# Patient Record
Sex: Female | Born: 1995 | Race: White | Hispanic: No | Marital: Single | Smoking: Never smoker
Health system: Southern US, Community
[De-identification: ages and names within clinical notes are randomized; demographics above are authoritative.]

## PROBLEM LIST (undated history)

## (undated) DIAGNOSIS — F509 Eating disorder, unspecified: Secondary | ICD-10-CM

## (undated) DIAGNOSIS — F329 Major depressive disorder, single episode, unspecified: Secondary | ICD-10-CM

## (undated) DIAGNOSIS — J45909 Unspecified asthma, uncomplicated: Secondary | ICD-10-CM

## (undated) DIAGNOSIS — F419 Anxiety disorder, unspecified: Secondary | ICD-10-CM

## (undated) DIAGNOSIS — T7840XA Allergy, unspecified, initial encounter: Secondary | ICD-10-CM

## (undated) DIAGNOSIS — F99 Mental disorder, not otherwise specified: Secondary | ICD-10-CM

## (undated) DIAGNOSIS — F909 Attention-deficit hyperactivity disorder, unspecified type: Secondary | ICD-10-CM

## (undated) DIAGNOSIS — F32A Depression, unspecified: Secondary | ICD-10-CM

## (undated) HISTORY — DX: Attention-deficit hyperactivity disorder, unspecified type: F90.9

## (undated) HISTORY — PX: KNEE SURGERY: SHX244

## (undated) HISTORY — DX: Allergy, unspecified, initial encounter: T78.40XA

## (undated) HISTORY — DX: Eating disorder, unspecified: F50.9

---
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
## 2015-06-14 ENCOUNTER — Ambulatory Visit (INDEPENDENT_AMBULATORY_CARE_PROVIDER_SITE_OTHER): Payer: 59 | Admitting: Physician Assistant

## 2015-06-14 VITALS — BP 123/85 | HR 116 | Temp 98.4°F | Resp 18 | Ht 66.0 in | Wt 120.0 lb

## 2015-06-14 DIAGNOSIS — R109 Unspecified abdominal pain: Secondary | ICD-10-CM

## 2015-06-14 DIAGNOSIS — R569 Unspecified convulsions: Secondary | ICD-10-CM

## 2015-06-14 DIAGNOSIS — R232 Flushing: Secondary | ICD-10-CM | POA: Diagnosis not present

## 2015-06-14 DIAGNOSIS — R110 Nausea: Secondary | ICD-10-CM | POA: Diagnosis not present

## 2015-06-14 DIAGNOSIS — R404 Transient alteration of awareness: Secondary | ICD-10-CM | POA: Diagnosis not present

## 2015-06-14 DIAGNOSIS — IMO0001 Reserved for inherently not codable concepts without codable children: Secondary | ICD-10-CM

## 2015-06-14 LAB — POCT CBC
Granulocyte percent: 59.9 %G (ref 37–80)
HEMATOCRIT: 33.7 % — AB (ref 37.7–47.9)
Hemoglobin: 11.5 g/dL — AB (ref 12.2–16.2)
LYMPH, POC: 1.4 (ref 0.6–3.4)
MCH, POC: 25.1 pg — AB (ref 27–31.2)
MCHC: 34.2 g/dL (ref 31.8–35.4)
MCV: 73.4 fL — AB (ref 80–97)
MID (CBC): 0.4 (ref 0–0.9)
MPV: 7.1 fL (ref 0–99.8)
POC GRANULOCYTE: 2.7 (ref 2–6.9)
POC LYMPH PERCENT: 31.3 %L (ref 10–50)
POC MID %: 8.8 % (ref 0–12)
Platelet Count, POC: 189 10*3/uL (ref 142–424)
RBC: 4.59 M/uL (ref 4.04–5.48)
RDW, POC: 14.9 %
WBC: 4.5 10*3/uL — AB (ref 4.6–10.2)

## 2015-06-14 LAB — COMPLETE METABOLIC PANEL WITH GFR
ALBUMIN: 4 g/dL (ref 3.6–5.1)
ALK PHOS: 37 U/L — AB (ref 47–176)
ALT: 10 U/L (ref 5–32)
AST: 12 U/L (ref 12–32)
BUN: 11 mg/dL (ref 7–20)
CALCIUM: 9.2 mg/dL (ref 8.9–10.4)
CHLORIDE: 105 mmol/L (ref 98–110)
CO2: 27 mmol/L (ref 20–31)
Creat: 0.87 mg/dL (ref 0.50–1.00)
GFR, Est African American: >89 mL/min (ref >=60)
GFR, Est Non African American: >89 mL/min (ref >=60)
Glucose, Bld: 77 mg/dL (ref 65–99)
Potassium: 4.5 mmol/L (ref 3.8–5.1)
Sodium: 139 mmol/L (ref 135–146)
TOTAL PROTEIN: 6.3 g/dL (ref 6.3–8.2)
Total Bilirubin: 1.2 mg/dL — ABNORMAL HIGH (ref 0.2–1.1)

## 2015-06-14 LAB — TSH: TSH: 0.65 mIU/L (ref 0.50–4.30)

## 2015-06-14 LAB — POCT GLYCOSYLATED HEMOGLOBIN (HGB A1C): Hemoglobin A1C: 5.2

## 2015-06-14 LAB — GLUCOSE, POCT (MANUAL RESULT ENTRY): POC Glucose: 82 mg/dl (ref 70–99)

## 2015-06-14 NOTE — Patient Instructions (Addendum)
     IF you received an x-ray today, you will receive an invoice from [facility]. Please contact [facility] at [phone] with questions or concerns regarding your invoice.   IF you received labwork today, you will receive an invoice from [facility]. Please contact [facility] at [phone] with questions or concerns regarding your invoice.   Our billing staff will not be able to assist you with questions regarding bills from these companies.  You will be contacted with the lab results as soon as they are available. The fastest way to get your results is to activate your My Chart account. Instructions are located on the last page of this paperwork. If you have not heard from us regarding the results in 2 weeks, please contact this office.     Please stay off of the fluoxetine at this time. Please await contact to both your attention specialist, as well as neurology for evaluation.

## 2015-06-14 NOTE — Progress Notes (Signed)
[facility] [address], [place] [address] [phone]  Date:  06/14/2015   Name:  Kimberly Solomon   DOB:  [birth year]   MRN:  [id number]  PCP:  No PCP Per Patient    History of Present Illness:  Kimberly Solomon is a 20 y.o. female patient who presents to [facility] for cc of seizure like symptoms for the last 2 weeks.      Week before, she stared off, and was not responding.  She started blingking her eyes, and she finally responded.  She started shaking and having jitters.  She became very flushed and was hot, and then chills.  This lasted for 10 minutes.  She complained of stomach pain and nausea.   The next week she was back to school and somewhat fatigued.  3 days later, she felt excited this week, and taking hot and cold flashes.  She was having dizziness.  4 days ago, she had a second episode of the staring.  Blinking--jitteriness/hot and cold flashes.  Stomach pain insued.  She decreased the fluoxetine to .5 for the last 3 days.  She has attempted to get in touch, Rosanna Randy, [facility].  This has discontinued today.  Fluoxetine was for 6 weeks.   Kimberly Solomon with [facility].  She will rarely skip the methylphenidate.  She will take this on weekends.   Therapist none She sees Dr. Madie Reno +agitation 4-6 hours of sleep, she generally lays and plays on her phone.  Feels like she has to finish schoolwork before she goes to bed.   She is a Consulting civil engineer at [employer]   Patient Active Problem List   Diagnosis Date Noted  . Eating disorder 04/16/2011    Past Medical History  Diagnosis Date  . Mental disorder   . Anxiety   . Depression   . Asthma     Past Surgical History  Procedure Laterality Date  . Tympanotomy  1999  . Adenoidectomy  1999  . Wisdom tooth extraction  2015  . Knee surgery      Social History  Substance Use Topics  . Smoking status: Never Smoker   . Smokeless tobacco: Never Used  . Alcohol Use: No     No family history on file.  Allergies  Allergen Reactions  . Adhesive [Tape]   . Codeine     Sister had seizure reaction  . Latex Other (See Comments)    blisters  . Neosporin [Neomycin-Bacitracin Zn-Polymyx] Other (See Comments)    Skin burns and blisters  . Amoxicillin Rash  . Penicillins Nausea And Vomiting and Rash    Medication list has been reviewed and updated.  Current Outpatient Prescriptions on File Prior to Visit  Medication Sig Dispense Refill  . albuterol (PROVENTIL HFA;VENTOLIN HFA) 108 (90 BASE) MCG/ACT inhaler Inhale 2 puffs into the lungs every 6 (six) hours as needed for wheezing or shortness of breath.    . hydrOXYzine (VISTARIL) 50 MG capsule Take 50 mg by mouth at bedtime.    Marland Kitchen ibuprofen (ADVIL,MOTRIN) 200 MG tablet Take 400 mg by mouth every 6 (six) hours as needed for mild pain or moderate pain.    Marland Kitchen loratadine (CLARITIN) 10 MG tablet Take 10 mg by mouth daily.    . Melatonin 5 MG CAPS Take 5 mg by mouth at bedtime.    . methylphenidate 36 MG PO CR tablet Take 36 mg by mouth 2 (two) times daily.    Marland Kitchen  norgestimate-ethinyl estradiol (ORTHO-CYCLEN,SPRINTEC,PREVIFEM) 0.25-35 MG-MCG tablet Take 1 tablet by mouth daily.     No current facility-administered medications on file prior to visit.    ROS ROS otherwise unremarkable unless listed above   Physical Examination: BP 123/85 mmHg  Pulse 116  Temp(Src) 98.4 F (36.9 C)  Resp 18  Ht 5\' 6"  (1.676 m)  Wt 120 lb (54.432 kg)  BMI 19.38 kg/m2 Ideal Body Weight: Weight in (lb) to have BMI = 25: 154.6  Physical Exam  Constitutional: She is oriented to person, place, and time. She appears well-developed and well-nourished. No distress.  HENT:  Head: Normocephalic and atraumatic.  Right Ear: Tympanic membrane, external ear and ear canal normal.  Left Ear: Tympanic membrane, external ear and ear canal normal.  Nose: Right sinus exhibits no maxillary sinus tenderness and no frontal sinus tenderness. Left  sinus exhibits no maxillary sinus tenderness and no frontal sinus tenderness.  Mouth/Throat: Oropharynx is clear and moist. No uvula swelling. No oropharyngeal exudate, posterior oropharyngeal edema or posterior oropharyngeal erythema.  Eyes: Conjunctivae and EOM are normal. Pupils are equal, round, and reactive to light.  Neck: Normal range of motion. Neck supple. No thyromegaly present.  Cardiovascular: Normal rate, regular rhythm, normal heart sounds and intact distal pulses.  Exam reveals no gallop, no distant heart sounds and no friction rub.   No murmur heard. Pulmonary/Chest: Effort normal and breath sounds normal. No respiratory distress. She has no decreased breath sounds. She has no wheezes. She has no rhonchi.  Abdominal: Soft. Bowel sounds are normal. She exhibits no distension and no mass. There is no tenderness.  Musculoskeletal: Normal range of motion. She exhibits no edema or tenderness.  Lymphadenopathy:       Head (right side): No submandibular, no tonsillar, no preauricular and no posterior auricular adenopathy present.       Head (left side): No submandibular, no tonsillar, no preauricular and no posterior auricular adenopathy present.    She has no cervical adenopathy.  Neurological: She is alert and oriented to person, place, and time. She displays no atrophy and no tremor. No cranial nerve deficit. She exhibits normal muscle tone. Coordination and gait normal. She displays no Babinski's sign on the right side. She displays no Babinski's sign on the left side.  Reflex Scores:      Patellar reflexes are 2+ on the right side and 2+ on the left side. Skin: Skin is warm and dry. She is not diaphoretic.  Psychiatric: She has a normal mood and affect. Her behavior is normal.    Results for orders placed or performed in visit on 06/14/15  POCT CBC  Result Value Ref Range   WBC 4.5 (A) 4.6 - 10.2 K/uL   Lymph, poc 1.4 0.6 - 3.4   POC LYMPH PERCENT 31.3 10 - 50 %L   MID (cbc)  0.4 0 - 0.9   POC MID % 8.8 0 - 12 %M   POC Granulocyte 2.7 2 - 6.9   Granulocyte percent 59.9 37 - 80 %G   RBC 4.59 4.04 - 5.48 M/uL   Hemoglobin 11.5 (A) 12.2 - 16.2 g/dL   HCT, POC 91.433.7 (A) 78.237.7 - 47.9 %   MCV 73.4 (A) 80 - 97 fL   MCH, POC 25.1 (A) 27 - 31.2 pg   MCHC 34.2 31.8 - 35.4 g/dL   RDW, POC 95.614.9 %   Platelet Count, POC 189 142 - 424 K/uL   MPV 7.1 0 - 99.8 fL  POCT  glucose (manual entry)  Result Value Ref Range   POC Glucose 82 70 - 99 mg/dl  POCT glycosylated hemoglobin (Hb A1C)  Result Value Ref Range   Hemoglobin A1C 5.2     Assessment and Plan: Caitlin Ainley Mcnerney is a 20 y.o. female who is here today for chief complaint of staring spells, nausea, jittery, hot and cold, and abdominal pain. There are significant symptoms that fit the manifestations of serotonin syndrome.  I have advised that she not take the fluoxetine at this time. She will be referred to a neurologist at this time--her neurologist. Also referring back to her psychiatrist.  Possibility this is psychosomatic.  Seizure-like activity (HCC) - Plan: Ambulatory referral to Neurology, Ambulatory referral to Psychiatry  Nausea without vomiting - Plan: POCT glucose (manual entry), POCT glycosylated hemoglobin (Hb A1C), Ambulatory referral to Neurology, Ambulatory referral to Psychiatry  Abdominal pain, unspecified abdominal location - Plan: POCT glucose (manual entry), POCT glycosylated hemoglobin (Hb A1C), Ambulatory referral to Psychiatry  Staring spell - Plan: POCT CBC, TSH, COMPLETE METABOLIC PANEL WITH GFR, Ambulatory referral to Neurology, Ambulatory referral to Psychiatry  Skin flushed - Plan: Ambulatory referral to Psychiatry  Trena Platt, PA-C [facility] 3/28/20177:54 PM

## 2015-06-16 ENCOUNTER — Telehealth: Payer: Self-pay

## 2015-06-16 NOTE — Telephone Encounter (Signed)
Pt mom calling to let stephanie know that the seizures are more frequent  Best number [phone]

## 2015-06-17 NOTE — Telephone Encounter (Signed)
Please advise the patient to contact her neurologist to see if she can be seen sooner, or if they are ok with waiting until 4/06.

## 2015-06-17 NOTE — Telephone Encounter (Signed)
Subject: Appointment                        The patient is scheduled for an appointment on 06/23/15 at 10:30am with Dr Karel JarvisAquino. Patient aware.                    You may have to call if you want it sooner.

## 2015-06-17 NOTE — Telephone Encounter (Signed)
When is the patient's appointments with her neurologist? It may need to be moved up.

## 2015-06-20 NOTE — Telephone Encounter (Signed)
Stephanie please review

## 2015-06-20 NOTE — Telephone Encounter (Signed)
SPoke with pt, advised mom.

## 2015-06-23 ENCOUNTER — Ambulatory Visit (INDEPENDENT_AMBULATORY_CARE_PROVIDER_SITE_OTHER): Payer: 59 | Admitting: Neurology

## 2015-06-23 ENCOUNTER — Encounter: Payer: Self-pay | Admitting: Neurology

## 2015-06-23 VITALS — BP 116/80 | HR 99 | Ht 66.75 in | Wt 123.0 lb

## 2015-06-23 DIAGNOSIS — R404 Transient alteration of awareness: Secondary | ICD-10-CM

## 2015-06-23 DIAGNOSIS — F32A Depression, unspecified: Secondary | ICD-10-CM

## 2015-06-23 DIAGNOSIS — F419 Anxiety disorder, unspecified: Secondary | ICD-10-CM | POA: Diagnosis not present

## 2015-06-23 DIAGNOSIS — F329 Major depressive disorder, single episode, unspecified: Secondary | ICD-10-CM

## 2015-06-23 MED ORDER — DIAZEPAM 5 MG PO TABS: ORAL_TABLET | ORAL | Status: DC

## 2015-06-23 NOTE — Patient Instructions (Signed)
1. Schedule MRI brain with and without contrast seizure protocol 2. Schedule 1-hour sleep-deprived EEG 3. Refer to Behavioral Medicine for anxiety and depression 4. As per [place] driving laws, no driving after an episode of loss of awareness until 6 months event-free 5. Follow-up after the tests

## 2015-06-23 NOTE — Progress Notes (Signed)
NEUROLOGY CONSULTATION NOTE  Kimberly Solomon MRN: [id number] DOB: 12/30/1995  Referring provider: Trena PlattStephanie English, PA Primary care provider: Trena PlattStephanie English, PA  Reason for consult:  Staring spells  Thank you for your kind referral of Kimberly Solomon for consultation of the above symptoms. Although her history is well known to you, please allow me to reiterate it for the purpose of our medical record. The patient was accompanied to the clinic by her mother who also provides collateral information. Records and images were personally reviewed where available.  HISTORY OF PRESENT ILLNESS: This is a pleasant 20 year old right-handed woman with a history of migraines, ADHD, depression, and anxiety, presenting for new onset recurrent episodes of staring and unresponsiveness. The first episode occurred on 3/17, her mother noticed she was staring and did not respond when she waved her hand in front of her. This only lasted a few seconds and patient was amnestic of it. The second episode occurred on 3/19, her mother again noticed her staring off, she had her arms crossed over her chest rubbing her upper arms, unresponsive for 1-2 minutes. When she came to, she reported feeling hot and became flushed in the face. She also reported stomach pain after. She reports feeling them coming on, she would feel weird, her mind would go blank, she feels hot and cold, then has no recollection of events. She had another episode on 3/24 when she told her mother she was feeling funny, then again started rubbing her upper arms and went into a stare angled to the right side for 2 minutes. She started blinking fast then came to, again saying she felt hot, then her stomach started feeling bad with nausea, no vomiting. She felt better after 30 minutes. She had a similar episode on 3/26 at her boyfriend's house. On 3/27 she was driving to school then lost focus just in her left eye, she felt she could not move her left eye  and her left eyelid was slowly closing. This lasted a few minutes with some nausea, and she had a bad headache with left hand tingling. She denies any headaches with the prior episodes. That evening, she again had the funny feeling followed by feeling sick.   She had been started on Prozac for anxiety and mood swings around 6 weeks ago, and they decided to cut the dose in half due to these episodes. On 3/28, she had a longer episode where she again started feeling unwell, then started staring off and rubbing her arms and became unresponsive. Her mother noticed her right hand started shaking, followed a few minutes later by her whole body shaking. Her mother reports it did not look like a seizure, "it was weird looking," she started blinking then closed her eyes mid-way. Her breathing was fast. After 6 minutes, she opened her eyes and kept opening and closing her mouth, then said she was thirsty. She also reported her left arm was numb. They went to the Urgent Care and stopped Prozac. Since stopping Prozac, they report the episodes have increased in frequency, she was having 3 episodes of body jerks daily lasting 2-3 minutes. Last Saturday, she had a staring episode without body jerks. She has not had any further spells in the past 4 days.   She has a history of infrequent migraines, usually occurring once a year. She has "regular" headaches occurring 3 times a month with no associated nausea, vomiting, photo/phonophobia. She has occasional dizziness described as brief spinning sensation, sometimes followed  by a headache. She denies any diplopia, dysarthria, dysphagia, neck/back pain, bowel/bladder dysfunction. She denies any olfactory/gustatory hallucinations, deja vu, myoclonic jerks. She has a history of anxiety and depression, had taken Zoloft in the past and takes hydroxyzine daily. She is currently a sophomore at [employer], reports this year has been more stressful and mother reports "girl drama."  She reports grades are good, no memory changes. Her mother has migraines. Her older sister had febrile seizures. Otherwise she had a normal birth and early development.  There is no history of febrile convulsions, CNS infections such as meningitis/encephalitis, significant traumatic brain injury, neurosurgical procedures, or family history of seizures.  PAST MEDICAL HISTORY: Past Medical History  Diagnosis Date  . Mental disorder   . Anxiety   . Depression   . Asthma   . ADHD (attention deficit hyperactivity disorder)   . Eating disorder     PAST SURGICAL HISTORY: Past Surgical History  Procedure Laterality Date  . Tympanotomy  1999  . Adenoidectomy  1999  . Wisdom tooth extraction  2015  . Knee surgery Left     MEDICATIONS: Current Outpatient Prescriptions on File Prior to Visit  Medication Sig Dispense Refill  . albuterol (PROVENTIL HFA;VENTOLIN HFA) 108 (90 BASE) MCG/ACT inhaler Inhale 2 puffs into the lungs every 6 (six) hours as needed for wheezing or shortness of breath.    . hydrOXYzine (VISTARIL) 50 MG capsule Take 50 mg by mouth daily. Takes 1 time up to 3 times daily as needed    . ibuprofen (ADVIL,MOTRIN) 200 MG tablet Take 400 mg by mouth every 6 (six) hours as needed for mild pain or moderate pain.    Marland Kitchen loratadine (CLARITIN) 10 MG tablet Take 10 mg by mouth daily.    . Melatonin 5 MG CAPS Take 5 mg by mouth at bedtime.    . methylphenidate 36 MG PO CR tablet Take 36 mg by mouth 2 (two) times daily.    . norgestimate-ethinyl estradiol (ORTHO-CYCLEN,SPRINTEC,PREVIFEM) 0.25-35 MG-MCG tablet Take 1 tablet by mouth daily.     No current facility-administered medications on file prior to visit.    ALLERGIES: Allergies  Allergen Reactions  . Adhesive [Tape]   . Codeine     Sister had seizure reaction  . Latex Other (See Comments)    blisters  . Neosporin [Neomycin-Bacitracin Zn-Polymyx] Other (See Comments)    Skin burns and blisters  . Amoxicillin Rash  .  Penicillins Nausea And Vomiting and Rash    FAMILY HISTORY: Family History  Problem Relation Age of Onset  . Diabetes Paternal Grandfather   . Hypertension Paternal Grandfather   . Stroke Paternal Grandfather   . Heart disease Paternal Grandfather   . Hyperlipidemia Paternal Grandfather   . Migraines Mother     SOCIAL HISTORY: Social History   Social History  . Marital Status: Single    Spouse Name: N/A  . Number of Children: 0  . Years of Education: N/A   Occupational History  . Student    Social History Main Topics  . Smoking status: Never Smoker   . Smokeless tobacco: Never Used  . Alcohol Use: No  . Drug Use: No  . Sexual Activity: No   Other Topics Concern  . Not on file   Social History Narrative    REVIEW OF SYSTEMS: Constitutional: No fevers, chills, or sweats, no generalized fatigue, change in appetite Eyes: No visual changes, double vision, eye pain Ear, nose and throat: No hearing loss, ear pain,  nasal congestion, sore throat Cardiovascular: No chest pain, palpitations Respiratory:  No shortness of breath at rest or with exertion, wheezes GastrointestinaI: No nausea, vomiting, diarrhea, abdominal pain, fecal incontinence Genitourinary:  No dysuria, urinary retention or frequency Musculoskeletal:  No neck pain, back pain Integumentary: No rash, pruritus, skin lesions Neurological: as above Psychiatric: No depression, insomnia, anxiety Endocrine: No palpitations, fatigue, diaphoresis, mood swings, change in appetite, change in weight, increased thirst Hematologic/Lymphatic:  No anemia, purpura, petechiae. Allergic/Immunologic: no itchy/runny eyes, nasal congestion, recent allergic reactions, rashes  PHYSICAL EXAM: Filed Vitals:   06/23/15 1010  BP: 116/80  Pulse: 99   General: No acute distress Head:  Normocephalic/atraumatic Eyes: Fundoscopic exam shows bilateral sharp discs, no vessel changes, exudates, or hemorrhages Neck: supple, no  paraspinal tenderness, full range of motion Back: No paraspinal tenderness Heart: regular rate and rhythm Lungs: Clear to auscultation bilaterally. Vascular: No carotid bruits. Skin/Extremities: No rash, no edema Neurological Exam: Mental status: alert and oriented to person, place, and time, no dysarthria or aphasia, Fund of knowledge is appropriate.  Recent and remote memory are intact. 3/3 delayed recall. Attention and concentration are normal.    Able to name objects and repeat phrases. Cranial nerves: CN I: not tested CN II: pupils equal, round and reactive to light, visual fields intact, fundi unremarkable. CN III, IV, VI:  full range of motion, no nystagmus, no ptosis CN V: facial sensation intact CN VII: upper and lower face symmetric CN VIII: hearing intact to finger rub CN IX, X: gag intact, uvula midline CN XI: sternocleidomastoid and trapezius muscles intact CN XII: tongue midline Bulk & Tone: normal, no fasciculations. Motor: 5/5 throughout with no pronator drift. Sensation: intact to light touch, cold, pin, vibration and joint position sense.  No extinction to double simultaneous stimulation.  Romberg test negative Deep Tendon Reflexes: +2 throughout, no ankle clonus Plantar responses: downgoing bilaterally Cerebellar: no incoordination on finger to nose, heel to shin. No dysdiadochokinesia Gait: narrow-based and steady, able to tandem walk adequately. Tremor: none  IMPRESSION: This is a pleasant 20 year old right-handed woman with a history of migraines, depression, anxiety, presenting for recurrent fairly stereotyped episodes of staring and unresponsiveness with possible hand automatisms that started 3 weeks ago. Since then, she has had similar staring spells but with body jerks. Focal seizures with impaired awareness are considered, however the description of the body jerks also raises the possibility of psychogenic non-epileptic events (PNES). MRI brain with and without  contrast and a 1-hour sleep-deprived EEG will be ordered to assess for focal abnormalities that increase risk for recurrent seizures. If EEG normal, we will plan for a 48-hour EEG to further classify these episodes. Would hold off on starting medication for now until tests are done. We also discussed the possibility of PNES, she does endorse stress and feels these may be related to stress, and is agreeable to seeing Behavioral Medicine.  [place] driving laws were discussed with the patient, and she knows to stop driving after an episode of loss of awareness, until 6 months event-free. She will follow-up after the tests.   Thank you for allowing me to participate in the care of this patient. Please do not hesitate to call for any questions or concerns.   Patrcia Dolly, M.D.  CC: Trena Platt, [place]

## 2015-06-30 ENCOUNTER — Ambulatory Visit
Admission: RE | Admit: 2015-06-30 | Discharge: 2015-06-30 | Disposition: A | Discharge status: Home / Self Care | Payer: 59 | Source: Ambulatory Visit | Attending: Neurology | Admitting: Neurology

## 2015-06-30 ENCOUNTER — Other Ambulatory Visit: Payer: Self-pay

## 2015-06-30 MED ORDER — GADOBENATE DIMEGLUMINE 529 MG/ML IV SOLN
11.0000 mL | Freq: Once | INTRAVENOUS | Status: AC | PRN
  Administered 2015-06-30: 11 mL via INTRAVENOUS

## 2015-07-05 ENCOUNTER — Telehealth: Payer: Self-pay | Admitting: Family Medicine

## 2015-07-05 NOTE — Telephone Encounter (Signed)
-----   Message from Van ClinesKaren M Aquino, MD sent at 07/04/2015  2:54 PM EDT ----- Pls let her know I reviewed MRI brain and it looks good, no evidence of tumor, stroke, or bleed. Proceed with EEG as discussed. Thanks

## 2015-07-05 NOTE — Telephone Encounter (Signed)
I spoke with patients mother/Angelia and notified her of result & advisement.

## 2015-07-11 ENCOUNTER — Ambulatory Visit (INDEPENDENT_AMBULATORY_CARE_PROVIDER_SITE_OTHER): Payer: 59 | Admitting: Neurology

## 2015-07-11 DIAGNOSIS — R404 Transient alteration of awareness: Secondary | ICD-10-CM | POA: Diagnosis not present

## 2015-07-11 NOTE — Procedures (Signed)
ONE HOUR SLEEP-DEPRIVED ELECTROENCEPHALOGRAM REPORT  Date of Study: 07/11/2015  Patient's Name: Kimberly Solomon MRN: [id number] Date of Birth: 1996-03-14  Referring Provider: Patrcia DollyKaren Aquino, MD  Indication: recurrent transient altered awareness/staring spells  Medications: Melatonin Vistaril claritin Methylphenidate Ortho-cylcen  Technical Summary: This is a multichannel digital EEG recording, using the international 10-20 placement system with electrodes applied with paste and impedances below 5000 ohms.    Description: The EEG background is symmetric, with a well-developed posterior dominant rhythm of 11-12 Hz, which is reactive to eye opening and closing.  Diffuse beta activity is seen, with a bilateral frontal preponderance.  No focal or generalized abnormalities are seen.  No focal or generalized epileptiform discharges are seen.  Stage II sleep is seen, with normal and symmetric sleep patterns.  Hyperventilation and photic stimulation were performed, and produced no abnormalities.  ECG revealed normal cardiac rate and rhythm.  Impression: This is a normal one-hour sleep-deprived EEG of the awake and asleep states, with activating procedures.  A normal study does not rule out the possibility of a seizure disorder in this patient.  Adam R. Everlena CooperJaffe, DO

## 2015-07-18 ENCOUNTER — Telehealth (HOSPITAL_COMMUNITY): Payer: Self-pay

## 2015-07-22 ENCOUNTER — Ambulatory Visit: Payer: Self-pay | Admitting: Neurology

## 2015-08-01 ENCOUNTER — Telehealth: Payer: Self-pay | Admitting: Neurology

## 2015-08-01 DIAGNOSIS — R404 Transient alteration of awareness: Secondary | ICD-10-CM

## 2015-08-01 NOTE — Telephone Encounter (Signed)
Routine EEG is normal.  Per Dr. Rosalyn GessAquino's last note, she would like to proceed with 48-hour ambulatory EEG.

## 2015-08-01 NOTE — Telephone Encounter (Signed)
Spoke to patients mother about results. Agreeable to proceed with 48 hour eeg. Will put in order & Darl PikesSusan will call with an appt.

## 2015-08-01 NOTE — Telephone Encounter (Signed)
PT's mother Marina Gravelngelia called and wanted to know if her test results were ready yet/Dawn CB# [phone]

## 2015-08-01 NOTE — Telephone Encounter (Signed)
Patients mother is requesting results of EEG.

## 2015-08-16 ENCOUNTER — Ambulatory Visit (INDEPENDENT_AMBULATORY_CARE_PROVIDER_SITE_OTHER): Payer: 59 | Admitting: Neurology

## 2015-08-16 DIAGNOSIS — R404 Transient alteration of awareness: Secondary | ICD-10-CM

## 2015-08-19 ENCOUNTER — Telehealth: Payer: Self-pay | Admitting: Family Medicine

## 2015-08-19 NOTE — Telephone Encounter (Signed)
Patients mother/Angelia returned my call. I notified her of results. Also reminded her about [place]ly [place] driving law, that Kimberly Solomon has to be 6 mths event free before she can drive.

## 2015-08-19 NOTE — Telephone Encounter (Signed)
Lmovm to rtn my call. 

## 2015-08-19 NOTE — Procedures (Signed)
48 HOUR AMBULATORY ELECTROENCEPHALOGRAM REPORT  Dates of Study: 08/16/2015 to 08/18/2015  Patient's Name: Kimberly GeraldBritney K Solomon MRN: [id number] Date of Birth: [birth year]  Referring Provider: Patrcia DollyKaren Aquino, MD  Indication: Recurrent staring spells.  Medications: Hydroxyzine, Melatonin, Iron, Sprintec, Claritin, Methyphenidate, Qnasal  Technical Summary: This is a multichannel digital EEG recording, using the international 10-20 placement system with electrodes applied with paste and impedances below 5000 ohms.    Description: The EEG background is symmetric, with a well-developed posterior dominant rhythm of 11 Hz, which is reactive to eye opening and closing.  Diffuse beta activity is seen, with a bilateral frontal preponderance.  No focal or generalized abnormalities are seen.  No focal or generalized epileptiform discharges are seen.  She did not exhibit her clinical habitual spells during the recording.  Stage II sleep is seen, with normal and symmetric sleep patterns.  ECG revealed normal cardiac rate and rhythm.  Impression: This is a normal 48 hour ambulatory EEG.  The patient's habitual spells were not captured on this study.  Therefore, this normal EEG does not rule out a seizure disorder in this patient.  Adam R. Everlena CooperJaffe, DO

## 2015-08-19 NOTE — Telephone Encounter (Signed)
-----   Message from Drema DallasAdam R Jaffe, DO sent at 08/19/2015  8:09 AM EDT ----- Ambulatory EEG is normal, although she did not demonstrate any of her spells.  No further changes, such as medication, is recommended at this time until she follows up with Dr. Karel JarvisAquino.  Again, as per [place] law, she cannot drive until she has not had a spell for 6 months.

## 2015-09-27 ENCOUNTER — Encounter (HOSPITAL_COMMUNITY): Payer: Self-pay | Admitting: Psychiatry

## 2015-09-27 ENCOUNTER — Ambulatory Visit (INDEPENDENT_AMBULATORY_CARE_PROVIDER_SITE_OTHER): Payer: 59 | Admitting: Psychiatry

## 2015-09-27 VITALS — BP 110/78 | HR 81 | Ht 67.0 in | Wt 124.4 lb

## 2015-09-27 DIAGNOSIS — F411 Generalized anxiety disorder: Secondary | ICD-10-CM | POA: Diagnosis not present

## 2015-09-27 DIAGNOSIS — F4010 Social phobia, unspecified: Secondary | ICD-10-CM

## 2015-09-27 DIAGNOSIS — F330 Major depressive disorder, recurrent, mild: Secondary | ICD-10-CM | POA: Diagnosis not present

## 2015-09-27 DIAGNOSIS — F410 Panic disorder [episodic paroxysmal anxiety] without agoraphobia: Secondary | ICD-10-CM | POA: Insufficient documentation

## 2015-09-27 DIAGNOSIS — F902 Attention-deficit hyperactivity disorder, combined type: Secondary | ICD-10-CM | POA: Insufficient documentation

## 2015-09-27 DIAGNOSIS — F5000 Anorexia nervosa, unspecified: Secondary | ICD-10-CM
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
## 2015-10-13 ENCOUNTER — Encounter: Payer: Self-pay | Admitting: Neurology

## 2015-10-13 ENCOUNTER — Ambulatory Visit (INDEPENDENT_AMBULATORY_CARE_PROVIDER_SITE_OTHER): Payer: 59 | Admitting: Neurology

## 2015-10-13 VITALS — BP 120/80 | HR 120 | Ht 67.0 in | Wt 125.0 lb

## 2015-10-13 DIAGNOSIS — F329 Major depressive disorder, single episode, unspecified: Secondary | ICD-10-CM | POA: Diagnosis not present

## 2015-10-13 DIAGNOSIS — F32A Depression, unspecified: Secondary | ICD-10-CM

## 2015-10-13 DIAGNOSIS — F419 Anxiety disorder, unspecified: Secondary | ICD-10-CM

## 2015-10-13 NOTE — Patient Instructions (Addendum)
You look great! Continue follow-up with your psychiatrist. Follow-up on an as needed basis, call our office for any changes.

## 2015-10-13 NOTE — Progress Notes (Signed)
NEUROLOGY FOLLOW UP OFFICE NOTE  Kimberly Solomon [id number]  HISTORY OF PRESENT ILLNESS: I had the pleasure of seeing Kimberly Solomon in follow-up in the neurology clinic on 10/13/2015 for recurrent episodes of staring and unresponsiveness that started in March 2017. The patient was last seen 3 months ago and is again accompanied by her mother who helps supplement the history today.  Records and images were personally reviewed where available.  I personally reviewed MRI brain with and without contrast which was normal, hippocampi symmetric with no abnormal signal or enhancement. 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 Her 1-hour sleep-deprived and prolonged 48-hour EEG were normal, typical events were not captured. She and her mother report that she has not had any further episodes of staring/unresponsiveness since the end of March. Debroah denies any further body jerks, paresthesias, no headaches. Her mother reports that she has been better since they stopped the Prozac, however they had previously reported that after stopping Prozac, the episodes increased in frequency. She has seen Psychiatry and will work on anxiety. She denies any dizziness, diplopia, focal numbness/tingling/weakness. She has some "sensory issues," where she is very sensitive to sounds and the ticking of a clock. She wears an ankle brace after she slipped on the stairs last weekend walking her dog, no loss of consciousness.  HPI 06/23/2015: This is a pleasant 20 yo RH woman with a history of migraines, ADHD, depression, and anxiety, who presented for new onset recurrent episodes of staring and unresponsiveness. The first episode occurred on 3/17, her mother noticed she was staring and did not respond when she waved her hand in front of her. This only lasted a few seconds and patient  was amnestic of it. The second episode occurred on 3/19, her mother again noticed her staring off, she had her arms crossed over her chest rubbing her upper arms, unresponsive for 1-2 minutes. When she came to, she reported feeling hot and became flushed in the face. She also reported stomach pain after. She reports feeling them coming on, she would feel weird, her mind would go blank, she feels hot and cold, then has no recollection of events. She had another episode on 3/24 when she told her mother she was feeling funny, then again started rubbing her upper arms and went into a stare angled to the right side for 2 minutes. She started blinking fast then came to, again saying she felt hot, then her stomach started feeling bad with nausea, no vomiting. She felt better after 30 minutes. She had a similar episode on 3/26 at her boyfriend's house. On 3/27 she was driving to school then lost focus just in her left eye, she felt she could not move her left eye and her left eyelid was slowly closing. This lasted a few minutes with some nausea, and she had a bad headache with left hand tingling. She denies any headaches with the prior episodes. That evening, she again had the funny feeling followed by feeling sick.   She had been started on Prozac for anxiety and mood swings around 6 weeks ago, and they decided to cut the dose in half due to these episodes. On 3/28, she had a longer episode where she again started feeling unwell, then started staring off and rubbing her arms and became unresponsive. Her mother noticed her right hand started shaking, followed a  few minutes later by her whole body shaking. Her mother reports it did not look like a seizure, "it was weird looking," she started blinking then closed her eyes mid-way. Her breathing was fast. After 6 minutes, she opened her eyes and kept opening and closing her mouth, then said she was thirsty. She also reported her left arm was numb. [facility] and [facility]. Since stopping Prozac, they report the episodes have increased in frequency, she was having 3 episodes of body jerks daily lasting 2-3 minutes. Last Saturday, she had a staring episode without body jerks. She has not had any further spells in the past 4 days.   She has a history of anxiety and depression, had taken Zoloft in the past and takes hydroxyzine daily. She is currently a sophomore at [employer], reports this year has been more stressful and mother reports "girl drama." She reports grades are good, no memory changes. Her mother has migraines. Her older sister had febrile seizures. Otherwise she had a normal birth and early development.  There is no history of febrile convulsions, CNS infections such as meningitis/encephalitis, significant traumatic brain injury, neurosurgical procedures, or family history of seizures  PAST MEDICAL HISTORY: Past Medical History:  Diagnosis Date  . ADHD (attention deficit hyperactivity disorder)   . Anxiety   . Asthma   . Depression   . Eating disorder   . Mental disorder     MEDICATIONS: Current Outpatient Prescriptions on File Prior to Visit  Medication Sig Dispense Refill  . albuterol (PROVENTIL HFA;VENTOLIN HFA) 108 (90 BASE) MCG/ACT inhaler Inhale 2 puffs into the lungs every 6 (six) hours as needed for wheezing or shortness of breath.    . Beclomethasone Dipropionate (QNASL) 80 MCG/ACT AERS Place 2 sprays into both nostrils daily.    . ferrous sulfate 325 (65 FE) MG tablet Take 325 mg by mouth daily with breakfast.    . hydrOXYzine (VISTARIL) 50 MG capsule Take 50 mg by mouth daily. Takes 1 time up to 3 times daily as needed    . ibuprofen (ADVIL,MOTRIN) 200 MG tablet Take 400 mg by mouth every 6 (six) hours as needed for mild pain or moderate pain.    Marland Kitchen loratadine (CLARITIN) 10 MG tablet Take 10 mg by mouth daily.    . Melatonin 5 MG CAPS Take 5 mg by mouth at bedtime.    . methylphenidate 36 MG PO CR tablet Take  36 mg by mouth 2 (two) times daily.    . Multiple Vitamins-Minerals (MULTIVITAMIN WITH MINERALS) tablet Take 1 tablet by mouth daily.    . norgestimate-ethinyl estradiol (ORTHO-CYCLEN,SPRINTEC,PREVIFEM) 0.25-35 MG-MCG tablet Take 1 tablet by mouth daily.     No current facility-administered medications on file prior to visit.     ALLERGIES: Allergies  Allergen Reactions  . Adhesive [Tape]   . Codeine     Sister had seizure reaction  . Latex Other (See Comments)    blisters  . Neosporin [Neomycin-Bacitracin Zn-Polymyx] Other (See Comments)    Skin burns and blisters  . Amoxicillin Rash  . Penicillins Nausea And Vomiting and Rash    FAMILY HISTORY: Family History  Problem Relation Age of Onset  . Diabetes Paternal Grandfather   . Hypertension Paternal Grandfather   . Stroke Paternal Grandfather   . Heart disease Paternal Grandfather   . Hyperlipidemia Paternal Grandfather   . Migraines Mother   . Anxiety disorder Mother   . Anxiety disorder Maternal Grandfather   .  Depression Maternal Grandfather   . Anxiety disorder Maternal Grandmother   . Suicidality Neg Hx   . ADD / ADHD Father     SOCIAL HISTORY: Social History   Social History  . Marital status: Single    Spouse name: N/A  . Number of children: 0  . Years of education: N/A   Occupational History  . Student    Social History Main Topics  . Smoking status: Never Smoker  . Smokeless tobacco: Never Used  . Alcohol use 0.0 oz/week     Comment: rare- last drank summer of 2016- usually 1 drink per episode  . Drug use: No  . Sexual activity: No   Other Topics Concern  . Not on file   Social History Narrative   Born in [place] but raised in [place] by both parents. Pt has one older sister. Pt goes to [employer] and lives alone. She is studying education. Single, never married, no kids.     REVIEW OF SYSTEMS: Constitutional: No fevers, chills, or sweats, no generalized fatigue, change in  appetite Eyes: No visual changes, double vision, eye pain Ear, nose and throat: No hearing loss, ear pain, nasal congestion, sore throat Cardiovascular: No chest pain, palpitations Respiratory:  No shortness of breath at rest or with exertion, wheezes GastrointestinaI: No nausea, vomiting, diarrhea, abdominal pain, fecal incontinence Genitourinary:  No dysuria, urinary retention or frequency Musculoskeletal:  No neck pain, back pain Integumentary: No rash, pruritus, skin lesions Neurological: as above Psychiatric: No depression, insomnia, +anxiety Endocrine: No palpitations, fatigue, diaphoresis, mood swings, change in appetite, change in weight, increased thirst Hematologic/Lymphatic:  No anemia, purpura, petechiae. Allergic/Immunologic: no itchy/runny eyes, nasal congestion, recent allergic reactions, rashes  PHYSICAL EXAM: Vitals:   10/13/15 0820  BP: 120/80  Pulse: (!) 120   General: No acute distress Head:  Normocephalic/atraumatic Neck: supple, no paraspinal tenderness, full range of motion Heart:  Regular rate and rhythm Lungs:  Clear to auscultation bilaterally Back: No paraspinal tenderness Skin/Extremities: No rash, no edema Neurological Exam: alert and oriented to person, place, and time. No aphasia or dysarthria. Fund of knowledge is appropriate.  Recent and remote memory are intact.  Attention and concentration are normal.    Able to name objects and repeat phrases. Cranial nerves: Pupils equal, round, reactive to light. Extraocular movements intact with no nystagmus. Visual fields full. Facial sensation intact. No facial asymmetry. Tongue, uvula, palate midline.  Motor: Bulk and tone normal, muscle strength 5/5 throughout with no pronator drift.  Sensation to light touch intact.  No extinction to double simultaneous stimulation.  Deep tendon reflexes 2+ throughout, toes downgoing.  Finger to nose testing intact.  Gait narrow-based and steady, able to tandem walk adequately.   Romberg negative.  IMPRESSION: This is a pleasant 20 yo RH woman with a history of migraines, depression, anxiety, who presented with new onset recurrent episodes of staring and unresponsiveness that started in March 2017, followed by staring spells with body jerks. MRI brain and prolonged 48-hour EEG normal. She has had no further episodes since the end of March. There is concern that Prozac had caused these symptoms, this would be an unusual side effect, if ever. There is no evidence of neurological cause for her symptoms, these were most likely related to psychiatric cause. No indication for starting medication from neurological standpoint. Recommend continued follow-up with psychiatry for anxiety. She will follow-up on a prn basis and knows to call for any changes.   Thank you for allowing  me to participate in her care.  Please do not hesitate to call for any questions or concerns.  The duration of this appointment visit was 15 minutes of face-to-face time with the patient.  Greater than 50% of this time was spent in counseling, explanation of diagnosis, planning of further management, and coordination of care.   Patrcia Dolly, M.D.   CC: Dr. Michae Kava

## 2016-03-01 ENCOUNTER — Ambulatory Visit (HOSPITAL_COMMUNITY): Payer: Self-pay | Admitting: Psychiatry

## 2016-09-04 ENCOUNTER — Ambulatory Visit (INDEPENDENT_AMBULATORY_CARE_PROVIDER_SITE_OTHER): Payer: 59 | Admitting: Sports Medicine

## 2016-09-04 ENCOUNTER — Ambulatory Visit: Payer: Self-pay

## 2016-09-04 ENCOUNTER — Encounter: Payer: Self-pay | Admitting: Sports Medicine

## 2016-09-04 VITALS — BP 118/82 | Ht 67.0 in | Wt 123.0 lb

## 2016-09-04 DIAGNOSIS — M25562 Pain in left knee: Secondary | ICD-10-CM

## 2016-09-04 DIAGNOSIS — M25569 Pain in unspecified knee: Secondary | ICD-10-CM

## 2016-09-04 DIAGNOSIS — G8929 Other chronic pain: Secondary | ICD-10-CM

## 2016-09-04 MED ORDER — AMITRIPTYLINE HCL 25 MG PO TABS: 25.0000 mg | ORAL_TABLET | Freq: Every day | ORAL | 1 refills | Status: DC

## 2016-09-04 NOTE — Progress Notes (Signed)
  CC:  Left knee pain/ Chronic  Referred by Dr. 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 knee pain 2 previous arthroscopic knee surgeries for meniscus tears 2015 and 2016 She recovered from those and was a cheerleader throughout HS  In February 2018 she had a twisting injury to her left knee while playing twister Physical therapy, injection and diclofenac have failed to resolve her pain MRI obtained was unremarkable  Symptoms now are frequent pain in the left knee Pain awakens her at night Knee feels stiff Note giving way locking or mechanical symptoms At the end of the day she may have some mild swelling  Past medical history Attention deficit disorder Asthma Generalized anxiety disorder  Medications - they are as listed and are reviewed  Review of systems She gets color change in the entire left leg Since the time of the injury she is also getting times when both feet will turn bluish Intermittently the left knee will feel tightness No other joint swelling  Physical exam Pleasant thin female in no acute distress BP 118/82   Ht 5\' 7"  (1.702 m)   Wt 123 lb (55.8 kg)   BMI 19.26 kg/m   She has significant hypermobility Both shoulders Elbow hyperextension Easily can put her palms on the floor Hyperextension of the right knee Excess rotation of both ankles Beighton score of 4  RT Knee: Normal to inspection with no erythema or effusion or obvious bony abnormalities. Palpation normal with no warmth or joint line tenderness or patellar tenderness or condyle tenderness. ROM normal  increased in flexion and extension  Ligaments with solid consistent endpoints including ACL, PCL, LCL, MCL. Negative Mcmurray's and provocative meniscal tests. Non painful patellar compression. Patellar and quadriceps tendons unremarkable. Hamstring and quadriceps strength is normal.  LT knee Stable exam as above However - she shows 30 deg less flexion No  hyperextension arthroscopy scars Mild puffiness  Ultrasound of left knee  Quadriceps tendon intact Patellar tendon intact Medial and lateral joint line show no swelling Meniscus looks intact medial and lateral Small joint effusion in the suprapatellar pouch not seen on the right There is some hyperechoic change and possible thickening of the joint capsule  Ultrasound and interpretation by Sibyl ParrKarl B. Darrick PennaFields, MD

## 2016-09-04 NOTE — Assessment & Plan Note (Signed)
The knee is stable and I do not think there is any new intra-articular injury I suspect this is a variant of complex regional pain syndrome with reflex dystrophy type changes in the left leg  Trial of easy motion exercises Compression fracture that he Amitriptyline 25 at night as she has disturbed sleep related to pain  I advised patience as this may take 6 to 12 mos.  Recheck in 6 weeks

## 2016-09-04 NOTE — Patient Instructions (Signed)
You have a reaction in your knee  This is called complex regional pain syndrome or reflex sympathetic dystrophy Similar to a frozen shoulder  We will try  Twice daily do a lot of easy motion exercise for the knee For maintenance of quad strength  Do straight leg raises with a 2 to 3 lb ankle weight  3 sets of 15 Turn foot out  Swimming and biking are helpful  Medicine Add amitriptyline 25 at night Makes you drowsy and mouth dry  Diclofenac use only when painful  Heat before motion exercise is good Compression sleeve when active At end of day if swollen try some ice  Recheck after you have been on medicine for 6 weeks

## 2016-10-11 ENCOUNTER — Ambulatory Visit: Payer: 59 | Admitting: Sports Medicine

## 2016-10-18 ENCOUNTER — Ambulatory Visit: Payer: 59 | Admitting: Sports Medicine

## 2016-10-24 ENCOUNTER — Other Ambulatory Visit: Payer: Self-pay

## 2016-10-24 MED ORDER — AMITRIPTYLINE HCL 25 MG PO TABS: 25.0000 mg | ORAL_TABLET | Freq: Every day | ORAL | 1 refills | Status: DC

## 2016-10-30 ENCOUNTER — Ambulatory Visit (INDEPENDENT_AMBULATORY_CARE_PROVIDER_SITE_OTHER): Payer: 59 | Admitting: Sports Medicine

## 2016-10-30 VITALS — BP 118/80 | Ht 67.0 in | Wt 125.0 lb

## 2016-10-30 DIAGNOSIS — M25562 Pain in left knee: Secondary | ICD-10-CM | POA: Diagnosis not present

## 2016-10-30 DIAGNOSIS — G5772 Causalgia of left lower limb: Secondary | ICD-10-CM | POA: Diagnosis not present

## 2016-10-30 DIAGNOSIS — G8929 Other chronic pain: Secondary | ICD-10-CM

## 2016-10-30 NOTE — Progress Notes (Signed)
Chief complaint: Follow-up left knee pain 6 months  History of present illness: Kimberly Solomon is a 21 year old female who presents to the sports medicine office today for follow-up of left knee pain. She has had this pain for the last 6 months. Back in Feb 2018, she did have a twisting injury to her left knee while playing twister. She did follow with local orthopedic office, started with physical therapy, cortisone injection, and diclofenac, all with no interval improvement in her symptoms. MRI of her left knee was obtained, did not show any acute abnormality. She was referred here by Dr Thurston Hole for second opinion evaluation.  She was seen here back on 09/04/16, with symptoms of pain in the left knee that is awakening her at nighttime. She also describes her knee feeling stiff. She reports having pain in the anterior left knee, feels more deep. She describes the pain as an aching pain. She is not report of any radiation of pain. She does not report of any popping, locking, or catching symptoms. Physical exam was overall unremarkable, ligaments that solid consistent endpoints and she had negative meniscal provocative testing.   Ultrasound the left knee was done, showed that her quadriceps tendon was intact, patellar tendon was intact, medial lateral joint line shows no swelling and meniscus looks intact both medially and laterally. She did have a small effusion in the suprapatellar pouch that was seen in the left side and not seen on the right side. She did have possible thickening of the joint capsule.   Main thing was that she did have limitation with flexion on physical examination, she could only go to 30 of flexion. Overall, it was felt that this was due to complex regional pain syndrome. She was started on a trial of easy motion exercises and was started on amitriptyline 25 mg nightly.   Overall today, she does report of significant interval improvement in her symptoms. She does not have any pain at  rest. She reports that only aggravating activity as squatting. She reports that when this occurs she does have pain rating at a 4 or 5 out of 10. (down from 8/10)  She reports that she has not been doing physical therapy as often as she would like, but has been doing it at home. She does report having more flexibility, particularly with left knee flexion, still having some pain but is able to flex her knee more. She does not report of any warmth, erythema, ecchymosis, or effusion. She reports of occasional popping and crepitus, but reports this is been a chronic issue for her and nothing new. She does not report of any symptoms of her left leg giving out on her. She reports she has been taken amitriptyline 25 mg nightly, has not noticed any side effects with this.  Review of systems:  As stated above  Physical exam: Vital signs are reviewed and are documented in the chart Gen.: Alert, oriented, appears stated age, in no apparent distress HEENT: Moist oral mucosa Respiratory: Normal respirations, able to speak in full sentences Cardiac: Regular rate, distal pulses 2+ Integumentary: No rashes on visible skin:  Neurologic: Strength 5/5 lateral lower extremities, sensation 2+ bilateral lower extremities Psych: Normal affect, mood is described as good Musculoskeletal: Inspection of left knee reveals stable scars from left knee arthroscopically, no obvious deformity or muscular atrophy seen, no warmth, erythema, ecchymosis, or effusion noted, no tenderness to palpation over quadriceps tendon, patellar tendon, medial joint line, lateral joint line, or popliteal fossa, Lachman, anterior drawer,  valgus, and varus stress testing negative, McMurray negative, she does have increased range of motion today, is able to flex her knee back to 60, was able to fully extend her left knee  Assessment and plan: 1. Left knee pain, suspect complex regional pain syndrome  Left knee pain -Suspect given interval  improvement with amitriptyline that this is related to complex regional pain syndrome and reflex symptom dystrophy, which can be seen after surgery -Discussed with her to continue with amitriptyline 25 mg nightly -At this point in time, she can start with doing elliptical and stationary biking, discussed if she has any pain to discontinue doing this -Discussed with her to continue with her home exercise program  Will have patient follow-up in 3 months or sooner as needed    Kimberly Solomon, M.D. Primary Care Sports Medicine Fellow [facility]  I observed and examined the patient with the fellow and agree with assessment and plan.  Note reviewed and modified by me.  Kimberly BaasKarl Fields, MD

## 2016-10-30 NOTE — Assessment & Plan Note (Signed)
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

## 2016-12-27 ENCOUNTER — Other Ambulatory Visit: Payer: Self-pay

## 2016-12-27 MED ORDER — AMITRIPTYLINE HCL 25 MG PO TABS: 25.0000 mg | ORAL_TABLET | Freq: Every day | ORAL | 2 refills | Status: DC

## 2017-02-12 ENCOUNTER — Encounter: Payer: Self-pay | Admitting: Sports Medicine

## 2017-02-12 ENCOUNTER — Ambulatory Visit (INDEPENDENT_AMBULATORY_CARE_PROVIDER_SITE_OTHER): Payer: 59 | Admitting: Sports Medicine

## 2017-02-12 DIAGNOSIS — G8929 Other chronic pain: Secondary | ICD-10-CM

## 2017-02-12 DIAGNOSIS — M25562 Pain in left knee: Secondary | ICD-10-CM

## 2017-02-12 NOTE — Progress Notes (Signed)
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 Arthroscopy portal scars Palpation normal with no warmth or joint line tenderness or patellar tenderness or condyle tenderness. ROM normal in flexion and extension and lower leg rotation. Ligaments with solid consistent endpoints including ACL, PCL, LCL, MCL. Negative Mcmurray's and provocative meniscal tests. Non painful patellar compression. Patellar and quadriceps tendons unremarkable. Hamstring and quadriceps strength is normal.  Mild VMO wekness vs RT Able to do one leg knee bend but more stable on RT

## 2017-02-12 NOTE — Assessment & Plan Note (Signed)
Dramatic improvement Now has minimal changes vs RT  Keep up amitriptyline for another 3 mos and then we will consider weaning She is actually responding faster than I expected  Start a series of 1 leg mini squats and knee bends for left knee  Reck 3 mos

## 2017-04-01 ENCOUNTER — Other Ambulatory Visit: Payer: Self-pay | Admitting: Sports Medicine

## 2017-04-02 ENCOUNTER — Other Ambulatory Visit: Payer: Self-pay | Admitting: *Deleted

## 2017-04-02 MED ORDER — AMITRIPTYLINE HCL 25 MG PO TABS: 25.0000 mg | ORAL_TABLET | Freq: Every day | ORAL | 1 refills | Status: DC

## 2017-04-08 NOTE — Progress Notes (Signed)
Subjective:    Patient ID: Kimberly Solomon, female    DOB: 1995/05/20, 22 y.o.   MRN: [id number]  HPI:  [name] is here to establish as a new pt.  She is a pleasant 22 year old female.  PMH: Asthma, GAD, ADHD, Chronic L knee pain/complex regional pain syndrome LLE, and hx of anorexia nervosa- in remission now. She feels that chronic conditions as well controlled on medication, regular exercise, and healthy eating. She has be in/out therapy for years and would like to re-start mental health provider-prefers female therapist. She will graduate this may from [employer] and also works as a Museum/gallery curator". She reports adequate sleep and denies tobacco/ETOH use She feels that her overall health is "good".  Patient Care Team    Relationship Specialty Notifications Start End  William Hamburger D, NP PCP - General Family Medicine  04/09/17   Alena Bills, MD Pediatrician Pediatrics  05/31/11    Comment: Dr. Clarene Duke is patient's current provider  Irena Cords, Enzo Montgomery, MD Consulting Physician Allergy and Immunology  04/09/17   Pati Gallo, MD Consulting Physician Sports Medicine  04/09/17   Cherlyn Roberts, MD Consulting Physician Dermatology  04/09/17   Mitchel Honour, DO Consulting Physician Obstetrics and Gynecology  04/09/17   Blima Ledger, OD  Optometry  04/09/17   Christia Reading, MD Consulting Physician Otolaryngology  04/09/17     Patient Active Problem List   Diagnosis Date Noted  . Healthcare maintenance 04/09/2017  . Knee pain, chronic 09/04/2016  . Anorexia nervosa 09/27/2015  . Social anxiety disorder 09/27/2015  . GAD (generalized anxiety disorder) 09/27/2015  . Panic disorder without agoraphobia 09/27/2015  . Attention deficit hyperactivity disorder (ADHD), combined type 09/27/2015  . 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 Transient alteration of awareness 06/23/2015  . Depression 06/23/2015  . Anxiety 06/23/2015  .  Eating disorder 04/16/2011     Past Medical History:  Diagnosis Date  . ADHD (attention deficit hyperactivity disorder)   . Allergy   . Anxiety   . Asthma   . Depression   . Eating disorder   . Mental disorder      Past Surgical History:  Procedure Laterality Date  . ADENOIDECTOMY  1999  . KNEE SURGERY Left   . TYMPANOTOMY  1999  . WISDOM TOOTH EXTRACTION  2015     Family History  Problem Relation Age of Onset  . Migraines Mother   . Anxiety disorder Mother   . Depression Mother   . ADD / ADHD Father   . Diabetes Paternal Grandfather   . Hypertension Paternal Grandfather   . Heart disease Paternal Grandfather   . Hyperlipidemia Paternal Grandfather   . Anxiety disorder Maternal Grandfather   . Depression Maternal Grandfather   . Drug abuse Maternal Grandfather   . Hyperlipidemia Maternal Grandfather   . Hypertension Maternal Grandfather   . Stroke Maternal Grandfather   . Heart disease Maternal Grandfather   . Anxiety disorder Maternal Grandmother   . Hyperlipidemia Paternal Grandmother   . Suicidality Neg Hx      Social History   Substance and Sexual Activity  Drug Use No     Social History   Substance and Sexual Activity  Alcohol Use No  . Alcohol/week: 0.0 oz  . Frequency: Never   Comment: rare- last drank summer of 2016- usually 1 drink per episode     Social History   Tobacco Use  Smoking Status Never Smoker  Smokeless Tobacco Never Used     Outpatient Encounter Medications as of 04/09/2017  Medication Sig  . Acetaminophen-Caff-Pyrilamine (MIDOL COMPLETE PO) Take 2 tablets by mouth 2 (two) times daily as needed.  Marland Kitchen. albuterol (PROVENTIL HFA;VENTOLIN HFA) 108 (90 BASE) MCG/ACT inhaler Inhale 2 puffs into the lungs every 6 (six) hours as needed for wheezing or shortness of breath.  Marland Kitchen. amitriptyline (ELAVIL) 25 MG tablet Take 1 tablet (25 mg total) by mouth at bedtime.  . Amphetamine ER (ADZENYS XR-ODT) 12.5 MG TBED DIS ONE T PO D  .  ARNUITY ELLIPTA 100 MCG/ACT AEPB INHALE 1 PUFF EVERY DAY FOR 30 DAYS  . Beclomethasone Dipropionate (QNASL) 80 MCG/ACT AERS Place 2 sprays into both nostrils daily.  . diphenhydrAMINE (BENADRYL) 25 MG tablet Take 25 mg by mouth every 6 (six) hours as needed.  Marland Kitchen. erythromycin with ethanol (EMGEL) 2 % gel Apply 1 application topically daily as needed.  . ferrous sulfate 325 (65 FE) MG tablet Take 325 mg by mouth daily with breakfast.  . hydrOXYzine (VISTARIL) 50 MG capsule Take 50 mg by mouth daily. Takes 1 time up to 3 times daily as needed  . ibuprofen (ADVIL,MOTRIN) 200 MG tablet Take 400 mg by mouth every 6 (six) hours as needed for mild pain or moderate pain.  Marland Kitchen. ketotifen (ZADITOR) 0.025 % ophthalmic solution Place 1 drop into both eyes 2 (two) times daily as needed.  . loratadine (CLARITIN) 10 MG tablet Take 10 mg by mouth daily.  . norgestimate-ethinyl estradiol (ORTHO-CYCLEN,SPRINTEC,PREVIFEM) 0.25-35 MG-MCG tablet Take 1 tablet by mouth daily.  . [DISCONTINUED] QNASL 80 MCG/ACT AERS   . DULoxetine (CYMBALTA) 30 MG capsule   . econazole nitrate 1 % cream Apply topically.  . [DISCONTINUED] ARNUITY ELLIPTA 100 MCG/ACT AEPB   . [DISCONTINUED] ARNUITY ELLIPTA 100 MCG/ACT AEPB   . [DISCONTINUED] diclofenac (VOLTAREN) 75 MG EC tablet Take 75 mg by mouth 2 (two) times daily with a meal.  . [DISCONTINUED] DULoxetine (CYMBALTA) 30 MG capsule Take 30 mg by mouth daily.  . [DISCONTINUED] DULoxetine (CYMBALTA) 30 MG capsule   . [DISCONTINUED] Ibuprofen 200 MG CAPS Take by mouth.  . [DISCONTINUED] ketoconazole (NIZORAL) 2 % shampoo APPLY FROM NECK TO TOES TONIGHT, LEAVE ON 10 MINUTES, RINSE OFF, REPEAT IN 1 WEEK  . [DISCONTINUED] ketoconazole (NIZORAL) 2 % shampoo   . [DISCONTINUED] Melatonin 5 MG CAPS Take 5 mg by mouth at bedtime.  . [DISCONTINUED] methylphenidate 36 MG PO CR tablet Take 36 mg by mouth 2 (two) times daily.  . [DISCONTINUED] Multiple Vitamins-Minerals (MULTIVITAMIN WITH MINERALS)  tablet Take 1 tablet by mouth daily.  . [DISCONTINUED] Multiple Vitamins-Minerals (MULTIVITAMIN WITH MINERALS) tablet Take by mouth.  . [DISCONTINUED] norgestimate-ethinyl estradiol (ORTHO-CYCLEN,SPRINTEC,PREVIFEM) 0.25-35 MG-MCG tablet Take by mouth.   No facility-administered encounter medications on file as of 04/09/2017.     Allergies: Prozac [fluoxetine hcl]; Adhesive [tape]; Codeine; Latex; Mobic [meloxicam]; Neosporin [neomycin-bacitracin zn-polymyx]; Amoxicillin; and Penicillins  Body mass index is 20.44 kg/m.  Blood pressure 102/67, pulse (!) 120, height 5\' 7"  (1.702 m), weight 130 lb 8 oz (59.2 kg), last menstrual period 03/13/2017, SpO2 100 %.       Review of Systems  Constitutional: Negative for activity change, appetite change, chills, diaphoresis, fatigue, fever and unexpected weight change.  HENT: Positive for congestion.   Eyes: Negative for visual disturbance.  Respiratory: Positive for wheezing. Negative for cough, chest tightness, shortness of breath and stridor.   Cardiovascular: Negative for chest pain, palpitations and leg swelling.  Gastrointestinal: Negative for abdominal distention, abdominal pain, blood in stool, constipation, diarrhea, nausea and vomiting.  Genitourinary: Negative for difficulty urinating and flank pain.  Musculoskeletal: Positive for arthralgias. Negative for back pain, gait problem, joint swelling, myalgias, neck pain and neck stiffness.       Chronic L knee pain/complex regional pain sydrome- treated with nightly amitriptyline 25mg   Neurological: Negative for dizziness and headaches.  Psychiatric/Behavioral: Negative for confusion, decreased concentration, dysphoric mood, hallucinations, self-injury, sleep disturbance and suicidal ideas. The patient is hyperactive. The patient is not nervous/anxious.        Objective:   Physical Exam  Constitutional: She appears well-developed and well-nourished. No distress.  Cardiovascular: Regular  rhythm, normal heart sounds and intact distal pulses. Tachycardia present.  No murmur heard. Pulmonary/Chest: Effort normal and breath sounds normal. No respiratory distress. She has no wheezes. She has no rales. She exhibits no tenderness.  Skin: Skin is warm and dry. No rash noted. She is not diaphoretic. No erythema. No pallor.  Psychiatric: She has a normal mood and affect. Her behavior is normal. Judgment and thought content normal.  Nursing note and vitals reviewed.         Assessment & Plan:   1. Anxiety   2. Attention deficit hyperactivity disorder (ADHD), combined type   3. GAD (generalized anxiety disorder)   4. Healthcare maintenance   5. Eating disorder     Healthcare maintenance Increase water intake, strive for at least 70 oz/day. Continue healthy eating and regular exercise. Continue all medications as directed. Recommend annual physical as well.  Eating disorder In remission  Attention deficit hyperactivity disorder (ADHD), combined type Has ADHD specialist- received treatment/RX from that provider  GAD (generalized anxiety disorder) Referral to mental health provider placed, requests female therapist She treats acute anxiety with Hydroxyzine 50mg  TID PRN, she reports typically only needing 2 tabs/daily    FOLLOW-UP:  Return in about 1 year (around 04/09/2018) for CPE.

## 2017-04-09 ENCOUNTER — Encounter: Payer: Self-pay | Admitting: Adult Health

## 2017-04-09 ENCOUNTER — Ambulatory Visit (INDEPENDENT_AMBULATORY_CARE_PROVIDER_SITE_OTHER): Payer: 59 | Admitting: Adult Health

## 2017-04-09 VITALS — BP 102/67 | HR 120 | Ht 67.0 in | Wt 130.5 lb

## 2017-04-09 DIAGNOSIS — F902 Attention-deficit hyperactivity disorder, combined type: Secondary | ICD-10-CM

## 2017-04-09 DIAGNOSIS — F419 Anxiety disorder, unspecified: Secondary | ICD-10-CM

## 2017-04-09 DIAGNOSIS — Z0000 Encounter for general adult medical examination without abnormal findings: Secondary | ICD-10-CM | POA: Insufficient documentation

## 2017-04-09 DIAGNOSIS — F411 Generalized anxiety disorder: Secondary | ICD-10-CM | POA: Diagnosis not present

## 2017-04-09 DIAGNOSIS — F509 Eating disorder, unspecified: Secondary | ICD-10-CM | POA: Diagnosis not present

## 2017-04-09 NOTE — Assessment & Plan Note (Signed)
Referral to mental health provider placed, requests female therapist She treats acute anxiety with Hydroxyzine 50mg  TID PRN, she reports typically only needing 2 tabs/daily

## 2017-04-09 NOTE — Assessment & Plan Note (Addendum)
Increase water intake, strive for at least 70 oz/day. Continue healthy eating and regular exercise. Continue all medications as directed. Recommend annual physical as well.

## 2017-04-09 NOTE — Patient Instructions (Signed)
Generalized Anxiety Disorder, Adult Generalized anxiety disorder (GAD) is a mental health disorder. People with this condition constantly worry about everyday events. Unlike normal anxiety, worry related to GAD is not triggered by a specific event. These worries also do not fade or get better with time. GAD interferes with life functions, including relationships, work, and school. GAD can vary from mild to severe. People with severe GAD can have intense waves of anxiety with physical symptoms (panic attacks). What are the causes? The exact cause of GAD is not known. What increases the risk? This condition is more likely to develop in:  Women.  People who have a family history of anxiety disorders.  People who are very shy.  People who experience very stressful life events, such as the death of a loved one.  People who have a very stressful family environment.  What are the signs or symptoms? People with GAD often worry excessively about many things in their lives, such as their health and family. They may also be overly concerned about:  Doing well at work.  Being on time.  Natural disasters.  Friendships.  Physical symptoms of GAD include:  Fatigue.  Muscle tension or having muscle twitches.  Trembling or feeling shaky.  Being easily startled.  Feeling like your heart is pounding or racing.  Feeling out of breath or like you cannot take a deep breath.  Having trouble falling asleep or staying asleep.  Sweating.  Nausea, diarrhea, or irritable bowel syndrome (IBS).  Headaches.  Trouble concentrating or remembering facts.  Restlessness.  Irritability.  How is this diagnosed? Your health care provider can diagnose GAD based on your symptoms and medical history. You will also have a physical exam. The health care provider will ask specific questions about your symptoms, including how severe they are, when they started, and if they come and go. Your health care  provider may ask you about your use of alcohol or drugs, including prescription medicines. Your health care provider may refer you to a mental health specialist for further evaluation. Your health care provider will do a thorough examination and may perform additional tests to rule out other possible causes of your symptoms. To be diagnosed with GAD, a person must have anxiety that:  Is out of his or her control.  Affects several different aspects of his or her life, such as work and relationships.  Causes distress that makes him or her unable to take part in normal activities.  Includes at least three physical symptoms of GAD, such as restlessness, fatigue, trouble concentrating, irritability, muscle tension, or sleep problems.  Before your health care provider can confirm a diagnosis of GAD, these symptoms must be present more days than they are not, and they must last for six months or longer. How is this treated? The following therapies are usually used to treat GAD:  Medicine. Antidepressant medicine is usually prescribed for long-term daily control. Antianxiety medicines may be added in severe cases, especially when panic attacks occur.  Talk therapy (psychotherapy). Certain types of talk therapy can be helpful in treating GAD by providing support, education, and guidance. Options include: ? Cognitive behavioral therapy (CBT). People learn coping skills and techniques to ease their anxiety. They learn to identify unrealistic or negative thoughts and behaviors and to replace them with positive ones. ? Acceptance and commitment therapy (ACT). This treatment teaches people how to be mindful as a way to cope with unwanted thoughts and feelings. ? Biofeedback. This process trains you to   manage your body's response (physiological response) through breathing techniques and relaxation methods. You will work with a therapist while machines are used to monitor your physical symptoms.  Stress  management techniques. These include yoga, meditation, and exercise.  A mental health specialist can help determine which treatment is best for you. Some people see improvement with one type of therapy. However, other people require a combination of therapies. Follow these instructions at home:  Take over-the-counter and prescription medicines only as told by your health care provider.  Try to maintain a normal routine.  Try to anticipate stressful situations and allow extra time to manage them.  Practice any stress management or self-calming techniques as taught by your health care provider.  Do not punish yourself for setbacks or for not making progress.  Try to recognize your accomplishments, even if they are small.  Keep all follow-up visits as told by your health care provider. This is important. Contact a health care provider if:  Your symptoms do not get better.  Your symptoms get worse.  You have signs of depression, such as: ? A persistently sad, cranky, or irritable mood. ? Loss of enjoyment in activities that used to bring you joy. ? Change in weight or eating. ? Changes in sleeping habits. ? Avoiding friends or family members. ? Loss of energy for normal tasks. ? Feelings of guilt or worthlessness. Get help right away if:  You have serious thoughts about hurting yourself or others. If you ever feel like you may hurt yourself or others, or have thoughts about taking your own life, get help right away. You can go to your nearest emergency department or call:  Your local emergency services (911 in the [place]  A suicide crisis helpline, such as the National Suicide Prevention Lifeline at 332-340-70251-(769)040-5981. This is open 24 hours a day.  Summary  Generalized anxiety disorder (GAD) is a mental health disorder that involves worry that is not triggered by a specific event.  People with GAD often worry excessively about many things in their lives, such as their health and  family.  GAD may cause physical symptoms such as restlessness, trouble concentrating, sleep problems, frequent sweating, nausea, diarrhea, headaches, and trembling or muscle twitching.  A mental health specialist can help determine which treatment is best for you. Some people see improvement with one type of therapy. However, other people require a combination of therapies. This information is not intended to replace advice given to you by your health care provider. Make sure you discuss any questions you have with your health care provider. Document Released: 06/30/2012 Document Revised: 01/24/2016 Document Reviewed: 01/24/2016 Elsevier Interactive Patient Education  2018 [employer]   Attention Deficit Hyperactivity Disorder, Pediatric Attention deficit hyperactivity disorder (ADHD) is a condition that can make it hard for a child to pay attention and concentrate or to control his or her behavior. The child may also have a lot of energy. ADHD is a disorder of the brain (neurodevelopmental disorder), and symptoms are typically first seen in early childhood. It is a common reason for behavioral and academic problems in school. There are three main types of ADHD:  Inattentive. With this type, children have difficulty paying attention.  Hyperactive-impulsive. With this type, children have a lot of energy and have difficulty controlling their behavior.  Combination. This type involves having symptoms of both of the other types.  ADHD is a lifelong condition. If it is not treated, the disorder can affect a child's future academic achievement, employment,  and relationships. What are the causes? The exact cause of this condition is not known. What increases the risk? This condition is more likely to develop in:  Children who have a first-degree relative, such as a parent or brother or sister, with the condition.  Children who had a low birth weight.  Children whose mothers had problems  during pregnancy or used alcohol or tobacco during pregnancy.  Children who have had a brain infection or a head injury.  Children who have been exposed to lead.  What are the signs or symptoms? Symptoms of this condition depend on the type of ADHD. Symptoms are listed here for each type: Inattentive  Problems with organization.  Difficulty staying focused.  Problems completing assignments at school.  Often making simple mistakes.  Problems sustaining mental effort.  Not listening to instructions.  Losing things often.  Forgetting things often.  Being easily distracted. Hyperactive-impulsive  Fidgeting often.  Difficulty sitting still in one's seat.  Talking a lot.  Talking out of turn.  Interrupting others.  Difficulty relaxing or doing quiet activities.  High energy levels and constant movement.  Difficulty waiting.  Always "on the go." Combination  Having symptoms of both of the other types. Children with ADHD may feel frustrated with themselves and may find school to be particularly discouraging. They often perform below their abilities in school. As children get older, the excess movement can lessen, but the problems with paying attention and staying organized often continue. Most children do not outgrow ADHD, but with good treatment, they can learn to cope with the symptoms. How is this diagnosed? This condition is diagnosed based on a child's symptoms and academic history. The child's health care provider will do a complete assessment. As part of the assessment, the health care provider will ask the child questions and will ask the parents and teachers for their observations of the child. The health care provider looks for specific symptoms of ADHD. Diagnosis will include:  Ruling out other reasons for the child's behavior.  Reviewing behavior rating scales that have been filled out about the child by people who deal with the child on a daily  basis.  A diagnosis is made only after all information from multiple people has been considered. How is this treated? Treatment for this condition may include:  Behavior therapy.  Medicines to decrease impulsivity and hyperactivity and to increase attention. Behavior therapy is preferred for children younger than 22 years old. The combination of medicine and behavior therapy is most effective for children older than 45 years of age.  Tutoring or extra support at school.  Techniques for parents to use at home to help manage their child's symptoms and behavior.  Follow these instructions at home: Eating and drinking  Offer your child a well-balanced diet. Breakfast that includes a balance of whole grains, protein, and fruits or vegetables is especially important for school performance.  If your child has trouble with hyperactivity, have your child avoid drinks that contain caffeine. These include: ? Soft drinks. ? Coffee. ? Tea.  If your child is older and finds that caffeinated drinks help to improve his or her attention, talk with your child's health care provider about what amount of caffeine intake is a safe for your child. Lifestyle   Make sure your child gets a full night of sleep and regular daily exercise.  Help manage your child's behavior by following the techniques learned in therapy. These may include: ? Looking for good behavior and rewarding  it. ? Making rules for behavior that your child can understand and follow. ? Giving clear instructions. ? Responding consistently to your child's challenging behaviors. ? Setting realistic goals. ? Looking for activities that can lead to success and self-esteem. ? Making time for pleasant activities with your child. ? Giving lots of affection.  Help your child learn to be organized. Some ways to do this include: ? Keeping daily schedules the same. Have a regular wake-up time and bedtime for your child. Schedule all activities,  including time for homework and time for play. Post the schedule in a place where your child will see it. Mark schedule changes in advance. ? Having a regular place for your child to store items such as clothing, backpacks, and school supplies. ? Encouraging your child to write down school assignments and to bring home needed books. Work with your child's teachers for assistance in organizing school work. General instructions  Learn as much as you can about ADHD. This will improve your ability to help your child and to make sure he or she gets the support needed. It will also help you educate your child's teachers and instructors if they do not feel that they have adequate knowledge or experience in these areas.  Work with your child's teachers to make sure your child gets the support and extra help that is needed. This may include: ? Tutoring. ? Teacher cues to help your child remain on task. ? Seating changes so your child is working at a desk that is free from distractions.  Give over-the-counter and prescription medicines only as told by your child's health care provider.  Keep all follow-up visits as told by your health care provider. This is important. Contact a health care provider if:  Your child has repeated muscle twitches (tics), coughs, or speech outbursts.  Your child has sleep problems.  Your child has a marked loss of appetite.  Your child develops depression.  Your child has new or worsening behavioral problems.  Your child has dizziness.  Your child has a racing heart.  Your child has stomach pains.  Your child develops headaches. Get help right away if:  Your child talks about or threatens suicide.  You are worried that your child is having a bad reaction to a medicine that he or she is taking for ADHD. This information is not intended to replace advice given to you by your health care provider. Make sure you discuss any questions you have with your health  care provider. Document Released: 02/23/2002 Document Revised: 11/02/2015 Document Reviewed: 09/29/2015 Elsevier Interactive Patient Education  2018 [employer].  Increase water intake, strive for at least 70 oz/day. Continue healthy eating and regular exercise. Continue all medications as directed. Due to ADHD medication- will need office visits every 3 months. Recommend annual physical as well. WELCOME TO THE PRACTICE!

## 2017-04-09 NOTE — Assessment & Plan Note (Signed)
Has ADHD specialist- received treatment/RX from that provider

## 2017-04-09 NOTE — Assessment & Plan Note (Signed)
In remission.

## 2017-04-10 ENCOUNTER — Encounter: Payer: Self-pay | Admitting: Adult Health

## 2017-05-14 ENCOUNTER — Encounter: Payer: Self-pay | Admitting: Sports Medicine

## 2017-05-14 ENCOUNTER — Ambulatory Visit (INDEPENDENT_AMBULATORY_CARE_PROVIDER_SITE_OTHER): Payer: 59 | Admitting: Sports Medicine

## 2017-05-14 DIAGNOSIS — G8929 Other chronic pain: Secondary | ICD-10-CM | POA: Diagnosis not present

## 2017-05-14 DIAGNOSIS — M25561 Pain in right knee: Secondary | ICD-10-CM | POA: Diagnosis not present

## 2017-05-14 MED ORDER — AMITRIPTYLINE HCL 25 MG PO TABS: 25.0000 mg | ORAL_TABLET | Freq: Every day | ORAL | 1 refills | Status: DC

## 2017-05-14 NOTE — Assessment & Plan Note (Signed)
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

## 2017-05-14 NOTE — Progress Notes (Signed)
CC: Sharp RT knee pain/ intermittent  Patient is a Financial tradercheer leading coach She has recently noted sharp knee pain over anterior RT knee No specific injury  Left knee that developed CRPS is now non painful Last pain > 1 month Using amitriptyline at night and that seemed to help  Past HX Note multiple anxiety issues HX of Eating disorder Non smoker  ROS No locking No giving way No swelling  PE Gen. - WDWN F in NAD BP (!) 124/59   Ht 5\' 7"  (1.702 m)   Wt 130 lb (59 kg)   BMI 20.36 kg/m   Knee: RT and LT Normal to inspection with no erythema or effusion or obvious bony abnormalities. Palpation normal with no warmth or joint line tenderness or patellar tenderness or condyle tenderness. ROM normal in flexion and extension and lower leg rotation. Ligaments with solid consistent endpoints including ACL, PCL, LCL, MCL. Negative Mcmurray's and provocative meniscal tests. Non painful patellar compression. Patellar and quadriceps tendons unremarkable. Hamstring and quadriceps strength is normal.  The only + finding is increasesd flexion and extension which causes clicking under the patella bilat  Very weak RT hip abduction All other tests strong

## 2017-05-22 LAB — HM PAP SMEAR: HM Pap smear: NEGATIVE

## 2017-05-29 ENCOUNTER — Ambulatory Visit (INDEPENDENT_AMBULATORY_CARE_PROVIDER_SITE_OTHER): Payer: 59 | Admitting: Psychology

## 2017-05-29 DIAGNOSIS — F410 Panic disorder [episodic paroxysmal anxiety] without agoraphobia: Secondary | ICD-10-CM

## 2017-06-07 ENCOUNTER — Ambulatory Visit (INDEPENDENT_AMBULATORY_CARE_PROVIDER_SITE_OTHER): Payer: 59 | Admitting: Psychology

## 2017-06-07 DIAGNOSIS — F410 Panic disorder [episodic paroxysmal anxiety] without agoraphobia: Secondary | ICD-10-CM

## 2017-06-14 ENCOUNTER — Ambulatory Visit (INDEPENDENT_AMBULATORY_CARE_PROVIDER_SITE_OTHER): Payer: 59 | Admitting: Psychology

## 2017-06-14 DIAGNOSIS — F410 Panic disorder [episodic paroxysmal anxiety] without agoraphobia: Secondary | ICD-10-CM

## 2017-06-21 ENCOUNTER — Ambulatory Visit: Payer: 59 | Admitting: Psychology

## 2017-06-25 ENCOUNTER — Encounter: Payer: Self-pay | Admitting: Sports Medicine

## 2017-06-25 ENCOUNTER — Ambulatory Visit (INDEPENDENT_AMBULATORY_CARE_PROVIDER_SITE_OTHER): Payer: 59 | Admitting: Sports Medicine

## 2017-06-25 VITALS — BP 112/82 | Ht 67.0 in | Wt 125.0 lb

## 2017-06-25 DIAGNOSIS — M222X2 Patellofemoral disorders, left knee: Secondary | ICD-10-CM | POA: Diagnosis not present

## 2017-06-25 DIAGNOSIS — M222X1 Patellofemoral disorders, right knee: Secondary | ICD-10-CM

## 2017-06-25 NOTE — Progress Notes (Signed)
Chief complaint: Follow up of bilateral anterior knee pain x 8 weeks  History of present illness: Kimberly Solomon is a 22 year old female who presents to sports medicine office today for follow-up of bilateral knee pain.  She was last here about 6 weeks ago back on 05/14/17.  At that time, her symptoms seem to be consistent with patellofemoral pain syndrome on both knees.  She recently had pain related to CPRS, specifically involving the left knee.  Fortunately, symptoms are much improved now to the point where she is completely off the amitriptyline without any issues.  She reports that from last office visit to today she is about 75% better.  She reports only noticing pain with extremes of knee flexion and single leg squatting. She does not report of any locking, catching, or symptoms of giving way.  She reports occasionally having some popping but no pain with popping.  She does not report of any swelling, warmth, erythema, or ecchymosis.  She reports only having to take Motrin maybe once a week.  She does not report of any numbness, tingling, or burning paresthesias.  She reports occasionally she will feel tightening and spasming in her right anterior hip musculature but that goes away within 30 seconds.  She reports that this started about a week after starting the hip abduction and hip flexor exercises.  Review of systems:  As stated above  Interval past medical history, surgical history, family history, and social history obtained and unchanged.  Her past medical history is notable for anxiety, depression, anorexia nervosa, and ADHD; surgical history is notable for adenoidectomy, tympanostomy, wisdom tooth extraction, left knee arthroscopy; she does not report of any current tobacco use; history notable for hypertension, diabetes, CAD, hyperlipidemia, anxiety, depression, CVA, drug abuse; allergies and medications are reviewed and are reflected in EMR.  Physical exam: Vital signs are reviewed and are  documented in the chart Gen.: Alert, oriented, appears stated age, in no apparent distress HEENT: Moist oral mucosa Respiratory: Normal respirations, able to speak in full sentences Cardiac: Regular rate, distal pulses 2+ Integumentary: No rashes on visible skin:  Neurologic: Strength 5/5 with hip flexion, hip extension, quadriceps, hamstring strength bilaterally, sensation 2+ in bilateral lower extremities Psych: Normal affect, mood is described as good Musculoskeletal: Inspection of both of her knees reveal no obvious deformity or muscle atrophy, no warmth, erythema, ecchymosis, or effusion, she is not tender to palpation today anywhere in either knee including the quadriceps tendon, patellar tendon, medial joint line, or lateral joint line, no signs of ligamentous instability as Lachman, anterior drawer, valgus, varus stress testing negative, McMurray negative for crepitus or pain,  Thessaly negative, range of motion today is from 2 degrees of hyperextension to 140 degrees of flexion bilaterally, she only has slight pain with extreme of flexion with single leg squat on the left side, nothing on the right side, no pain with single leg hopping on either side  Assessment and plan: 1.  Bilateral patellofemoral pain syndrome, with symptoms resolving 2.  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  Suspect that with continued physical therapy exercises and strengthening with hip flexors, hip abduction, quadriceps, and hamstring that she will continue to get better.  Discussed the use over-the-counter Motrin as needed for pain.  Discussed will now keep things open ended and have her follow-up on as-needed basis.  Haynes Kern[place], M.D. Primary Care Sports Medicine Fellow [facility]  I  observed and examined the patient with the [facility] and agree with assessment and plan.  Note reviewed and  modified by me. Enid Baas, MD

## 2017-06-28 ENCOUNTER — Ambulatory Visit: Payer: 59 | Admitting: Psychology

## 2017-07-04 ENCOUNTER — Encounter: Payer: 59 | Admitting: Adult Health

## 2017-07-10 ENCOUNTER — Encounter: Payer: Self-pay | Admitting: Adult Health

## 2017-07-12 ENCOUNTER — Ambulatory Visit: Payer: 59 | Admitting: Psychology

## 2017-07-15 ENCOUNTER — Ambulatory Visit: Payer: 59 | Admitting: Psychology

## 2017-07-18 ENCOUNTER — Ambulatory Visit (INDEPENDENT_AMBULATORY_CARE_PROVIDER_SITE_OTHER): Payer: 59 | Admitting: Psychology

## 2017-07-18 DIAGNOSIS — F410 Panic disorder [episodic paroxysmal anxiety] without agoraphobia: Secondary | ICD-10-CM

## 2017-07-24 ENCOUNTER — Ambulatory Visit (INDEPENDENT_AMBULATORY_CARE_PROVIDER_SITE_OTHER): Payer: 59 | Admitting: Psychology

## 2017-07-24 DIAGNOSIS — F410 Panic disorder [episodic paroxysmal anxiety] without agoraphobia: Secondary | ICD-10-CM

## 2017-08-07 ENCOUNTER — Ambulatory Visit: Payer: 59 | Admitting: Psychology

## 2017-08-09 ENCOUNTER — Ambulatory Visit: Payer: 59 | Admitting: Psychology

## 2017-08-16 ENCOUNTER — Ambulatory Visit (INDEPENDENT_AMBULATORY_CARE_PROVIDER_SITE_OTHER): Payer: 59 | Admitting: Psychology

## 2017-08-16 DIAGNOSIS — F410 Panic disorder [episodic paroxysmal anxiety] without agoraphobia: Secondary | ICD-10-CM | POA: Diagnosis not present

## 2017-08-28 ENCOUNTER — Ambulatory Visit (INDEPENDENT_AMBULATORY_CARE_PROVIDER_SITE_OTHER): Payer: 59 | Admitting: Psychology

## 2017-08-28 DIAGNOSIS — F410 Panic disorder [episodic paroxysmal anxiety] without agoraphobia: Secondary | ICD-10-CM | POA: Diagnosis not present

## 2017-09-11 ENCOUNTER — Ambulatory Visit (INDEPENDENT_AMBULATORY_CARE_PROVIDER_SITE_OTHER): Payer: 59 | Admitting: Psychology

## 2017-09-11 DIAGNOSIS — F410 Panic disorder [episodic paroxysmal anxiety] without agoraphobia: Secondary | ICD-10-CM

## 2017-09-26 ENCOUNTER — Encounter: Payer: Self-pay | Admitting: Adult Health

## 2017-09-27 ENCOUNTER — Ambulatory Visit: Payer: 59 | Admitting: Psychology

## 2017-10-01 NOTE — Progress Notes (Signed)
Subjective:    Patient ID: [name], female    DOB: 09-Jul-1995, 22 y.o.   MRN: [id number]  HPI: 04/09/17 OV:  [name] is here to establish as a new pt.  She is a pleasant 22 year old female.  PMH: Asthma, GAD, ADHD, Chronic L knee pain/complex regional pain syndrome LLE, and hx of anorexia nervosa- in remission now. She feels that chronic conditions as well controlled on medication, regular exercise, and healthy eating. She has be in/out therapy for years and would like to re-start mental health provider-prefers female therapist. She will graduate this may from [employer] and also works as a Museum/gallery curator"cheer coach". She reports adequate sleep and denies tobacco/ETOH use She feels that her overall health is "good".  10/02/17 OV: [name] is here for CPE She reports medication compliance, denies SE She reports mood is stable on Pristiq 50mg  QD- she denies thoughts of harming herself/others  Healthcare Maintenance: PAP-UTD, last 05/2017-normal Mammogram- not indicated Immunizations- UTD  Patient Care Team    Relationship Specialty Notifications Start End  Danford, Orpha BurKaty D, NP PCP - General Family Medicine  04/09/17   Alena BillsLittle, Edgar, MD Pediatrician Pediatrics  05/31/11    Comment: Dr. Clarene DukeLittle is patient's current provider  Irena CordsVan Winkle, Enzo Montgomeryobert C, MD Consulting Physician Allergy and Immunology  04/09/17   Pati GalloKramer, James, MD Consulting Physician Sports Medicine  04/09/17   Cherlyn RobertsLupton, Frederick, MD Consulting Physician Dermatology  04/09/17   Mitchel HonourMorris, Megan, DO Consulting Physician Obstetrics and Gynecology  04/09/17   Blima LedgerMiller, Sally, OD  Optometry  04/09/17   Christia ReadingBates, Dwight, MD Consulting Physician Otolaryngology  04/09/17     Patient Active Problem List   Diagnosis Date Noted  . Asthma 10/02/2017  . Healthcare maintenance 04/09/2017  . Knee pain, chronic 09/04/2016  . Anorexia nervosa 09/27/2015  . Social anxiety disorder 09/27/2015  . GAD (generalized anxiety disorder)  09/27/2015  . Panic disorder without agoraphobia 09/27/2015  . Attention deficit hyperactivity disorder (ADHD), combined type 09/27/2015  . 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 Transient alteration of awareness 06/23/2015  . Depression 06/23/2015  . Anxiety 06/23/2015  . Eating disorder 04/16/2011     Past Medical History:  Diagnosis Date  . ADHD (attention deficit hyperactivity disorder)   . Allergy   . Anxiety   . Asthma   . Depression   . Eating disorder   . Mental disorder      Past Surgical History:  Procedure Laterality Date  . ADENOIDECTOMY  1999  . KNEE SURGERY Left   . TYMPANOTOMY  1999  . WISDOM TOOTH EXTRACTION  2015     Family History  Problem Relation Age of Onset  . Migraines Mother   . Anxiety disorder Mother   . Depression Mother   . ADD / ADHD Father   . Diabetes Paternal Grandfather   . Hypertension Paternal Grandfather   . Heart disease Paternal Grandfather   . Hyperlipidemia Paternal Grandfather   . Anxiety disorder Maternal Grandfather   . Depression Maternal Grandfather   . Drug abuse Maternal Grandfather   . Hyperlipidemia Maternal Grandfather   . Hypertension Maternal Grandfather   . Stroke Maternal Grandfather   . Heart disease Maternal Grandfather   . Anxiety disorder Maternal Grandmother   . Hyperlipidemia Paternal Grandmother   . Suicidality Neg Hx      Social History   Substance and Sexual Activity  Drug Use No     Social History  Substance and Sexual Activity  Alcohol Use No  . Alcohol/week: 0.0 oz  . Frequency: Never   Comment: rare- last drank summer of 2016- usually 1 drink per episode     Social History   Tobacco Use  Smoking Status Never Smoker  Smokeless Tobacco Never Used     Outpatient Encounter Medications as of 10/02/2017  Medication Sig  . Acetaminophen-Caff-Pyrilamine (MIDOL COMPLETE PO) Take 2 tablets by mouth 2 (two) times daily as needed.  Marland Kitchen albuterol  (PROVENTIL HFA;VENTOLIN HFA) 108 (90 BASE) MCG/ACT inhaler Inhale 2 puffs into the lungs every 6 (six) hours as needed for wheezing or shortness of breath.  . Amphetamine ER (ADZENYS XR-ODT) 12.5 MG TBED DIS ONE T PO D  . Beclomethasone Dipropionate 80 MCG/ACT AERS Place 80 mcg into the nose. 2 puffs into each nostril once daily as needed.  . desvenlafaxine (PRISTIQ) 50 MG 24 hr tablet Take 50 mg by mouth daily.  . diphenhydrAMINE (BENADRYL) 25 MG tablet Take 25 mg by mouth every 6 (six) hours as needed.  Marland Kitchen erythromycin with ethanol (EMGEL) 2 % gel Apply 1 application topically daily as needed.  . ferrous sulfate 325 (65 FE) MG tablet Take 325 mg by mouth daily with breakfast.  . fluticasone furoate-vilanterol (BREO ELLIPTA) 100-25 MCG/INH AEPB TAKE 1 PUFF BY MOUTH EVERY DAY  . hydrOXYzine (VISTARIL) 50 MG capsule Take 50 mg by mouth daily. Takes 1 time up to 3 times daily as needed  . ibuprofen (ADVIL,MOTRIN) 200 MG tablet Take 400 mg by mouth every 6 (six) hours as needed for mild pain or moderate pain.  Marland Kitchen ketotifen (ZADITOR) 0.025 % ophthalmic solution Place 1 drop into both eyes 2 (two) times daily as needed.  . loratadine (CLARITIN) 10 MG tablet Take 10 mg by mouth daily.  . norgestimate-ethinyl estradiol (ORTHO-CYCLEN,SPRINTEC,PREVIFEM) 0.25-35 MG-MCG tablet Take 1 tablet by mouth daily.   No facility-administered encounter medications on file as of 10/02/2017.     Allergies: Prozac [fluoxetine hcl]; Adhesive [tape]; Codeine; Latex; Mobic [meloxicam]; Neosporin [neomycin-bacitracin zn-polymyx]; Amoxicillin; and Penicillins  Body mass index is 20.92 kg/m.  Blood pressure 101/69, pulse 89, height 5\' 7"  (1.702 m), weight 133 lb 9.6 oz (60.6 kg), last menstrual period 09/24/2017, SpO2 100 %.  Review of Systems  Constitutional: Negative for activity change, appetite change, chills, diaphoresis, fatigue, fever and unexpected weight change.  HENT: Positive for congestion.   Eyes: Negative  for visual disturbance.  Respiratory: Positive for wheezing. Negative for cough, chest tightness, shortness of breath and stridor.   Cardiovascular: Negative for chest pain, palpitations and leg swelling.  Gastrointestinal: Negative for abdominal distention, abdominal pain, blood in stool, constipation, diarrhea, nausea and vomiting.  Genitourinary: Negative for difficulty urinating and flank pain.  Musculoskeletal: Positive for arthralgias. Negative for back pain, gait problem, joint swelling, myalgias, neck pain and neck stiffness.       Chronic L knee pain/complex regional pain sydrome- treated with nightly amitriptyline 25mg   Neurological: Negative for dizziness and headaches.  Psychiatric/Behavioral: Negative for confusion, decreased concentration, dysphoric mood, hallucinations, self-injury, sleep disturbance and suicidal ideas. The patient is hyperactive. The patient is not nervous/anxious.        Objective:   Physical Exam  Constitutional: She is oriented to person, place, and time. She appears well-developed and well-nourished. No distress.  HENT:  Head: Normocephalic and atraumatic.  Right Ear: External ear normal. Tympanic membrane is not perforated and not bulging. No decreased hearing is noted.  Left Ear: External ear normal.  Tympanic membrane is not perforated and not bulging. No decreased hearing is noted.  Nose: Nose normal. No mucosal edema. Right sinus exhibits no maxillary sinus tenderness and no frontal sinus tenderness. Left sinus exhibits no maxillary sinus tenderness and no frontal sinus tenderness.  Mouth/Throat: Uvula is midline, oropharynx is clear and moist and mucous membranes are normal.  Eyes: Pupils are equal, round, and reactive to light. Conjunctivae are normal.  Neck: Normal range of motion. Neck supple.  Cardiovascular: Regular rhythm, normal heart sounds and intact distal pulses.  No murmur heard. Pulmonary/Chest: Effort normal and breath sounds normal. No  respiratory distress. She has no wheezes. She has no rales. She exhibits no tenderness.  Abdominal: Soft. Bowel sounds are normal. She exhibits no distension and no mass. There is no tenderness. There is no rebound and no guarding.  Musculoskeletal: Normal range of motion. She exhibits no edema or tenderness.       Right knee: Normal.  Well healed trocar sites on L knee  Lymphadenopathy:    She has no cervical adenopathy.  Neurological: She is alert and oriented to person, place, and time. Coordination normal.  Skin: Skin is warm and dry. No rash noted. She is not diaphoretic. No erythema. No pallor.  Psychiatric: She has a normal mood and affect. Her behavior is normal. Judgment and thought content normal.  Nursing note and vitals reviewed.     Assessment & Plan:   1. Healthcare maintenance   2. GAD (generalized anxiety disorder)   3. Asthma, unspecified asthma severity, unspecified whether complicated, unspecified whether persistent     GAD (generalized anxiety disorder) Stable on Pristiq 50mg  QD Denies thoughts of harming herself/others  Healthcare maintenance Continue all medications as directed. Increase water intake, strive for at least 75 oz/day. Continue to avoid any tobacco products! Congratulations on your college graduation, a great job is out there somewhere! Recommend annual physicals.  Asthma Stable Uses Albuterol inhaler several times/month    FOLLOW-UP:  Return in about 1 year (around 10/03/2018) for CPE.

## 2017-10-02 ENCOUNTER — Ambulatory Visit (INDEPENDENT_AMBULATORY_CARE_PROVIDER_SITE_OTHER): Payer: 59 | Admitting: Adult Health

## 2017-10-02 ENCOUNTER — Encounter: Payer: Self-pay | Admitting: Adult Health

## 2017-10-02 VITALS — BP 101/69 | HR 89 | Ht 67.0 in | Wt 133.6 lb

## 2017-10-02 DIAGNOSIS — J45909 Unspecified asthma, uncomplicated: Secondary | ICD-10-CM | POA: Diagnosis not present

## 2017-10-02 DIAGNOSIS — F411 Generalized anxiety disorder: Secondary | ICD-10-CM | POA: Diagnosis not present

## 2017-10-02 DIAGNOSIS — Z0000 Encounter for general adult medical examination without abnormal findings: Secondary | ICD-10-CM | POA: Diagnosis not present

## 2017-10-02 NOTE — Assessment & Plan Note (Signed)
Stable on Pristiq 50mg  QD Denies thoughts of harming herself/others

## 2017-10-02 NOTE — Assessment & Plan Note (Signed)
Stable Uses Albuterol inhaler several times/month

## 2017-10-02 NOTE — Assessment & Plan Note (Signed)
Continue all medications as directed. Increase water intake, strive for at least 75 oz/day. Continue to avoid any tobacco products! Congratulations on your college graduation, a great job is out there somewhere! Recommend annual physicals.

## 2017-10-02 NOTE — Patient Instructions (Addendum)
Mediterranean Diet A Mediterranean diet refers to food and lifestyle choices that are based on the traditions of countries located on the [place]. This way of eating has been shown to help prevent certain conditions and improve outcomes for people who have chronic diseases, like kidney disease and heart disease. What are tips for following this plan? Lifestyle  Cook and eat meals together with your family, when possible.  Drink enough fluid to keep your urine clear or pale yellow.  Be physically active every day. This includes: ? Aerobic exercise like running or swimming. ? Leisure activities like gardening, walking, or housework.  Get 7-8 hours of sleep each night.  If recommended by your health care provider, drink red wine in moderation. This means 1 glass a day for nonpregnant women and 2 glasses a day for men. A glass of wine equals 5 oz (150 mL). Reading food labels  Check the serving size of packaged foods. For foods such as rice and pasta, the serving size refers to the amount of cooked product, not dry.  Check the total fat in packaged foods. Avoid foods that have saturated fat or trans fats.  Check the ingredients list for added sugars, such as corn syrup. Shopping  At the grocery store, buy most of your food from the areas near the walls of the store. This includes: ? Fresh fruits and vegetables (produce). ? Grains, beans, nuts, and seeds. Some of these may be available in unpackaged forms or large amounts (in bulk). ? Fresh seafood. ? Poultry and eggs. ? Low-fat dairy products.  Buy whole ingredients instead of prepackaged foods.  Buy fresh fruits and vegetables in-season from local farmers markets.  Buy frozen fruits and vegetables in resealable bags.  If you do not have access to quality fresh seafood, buy precooked frozen shrimp or canned fish, such as tuna, salmon, or sardines.  Buy small amounts of raw or cooked vegetables, salads, or olives from the  deli or salad bar at your store.  Stock your pantry so you always have certain foods on hand, such as olive oil, canned tuna, canned tomatoes, rice, pasta, and beans. Cooking  Cook foods with extra-virgin olive oil instead of using butter or other vegetable oils.  Have meat as a side dish, and have vegetables or grains as your main dish. This means having meat in small portions or adding small amounts of meat to foods like pasta or stew.  Use beans or vegetables instead of meat in common dishes like chili or lasagna.  Experiment with different cooking methods. Try roasting or broiling vegetables instead of steaming or sauteing them.  Add frozen vegetables to soups, stews, pasta, or rice.  Add nuts or seeds for added healthy fat at each meal. You can add these to yogurt, salads, or vegetable dishes.  Marinate fish or vegetables using olive oil, lemon juice, garlic, and fresh herbs. Meal planning  Plan to eat 1 vegetarian meal one day each week. Try to work up to 2 vegetarian meals, if possible.  Eat seafood 2 or more times a week.  Have healthy snacks readily available, such as: ? Vegetable sticks with hummus. ? Greek yogurt. ? Fruit and nut trail mix.  Eat balanced meals throughout the week. This includes: ? Fruit: 2-3 servings a day ? Vegetables: 4-5 servings a day ? Low-fat dairy: 2 servings a day ? Fish, poultry, or lean meat: 1 serving a day ? Beans and legumes: 2 or more servings a week ? Nuts   and seeds: 1-2 servings a day ? Whole grains: 6-8 servings a day ? Extra-virgin olive oil: 3-4 servings a day  Limit red meat and sweets to only a few servings a month What are my food choices?  Mediterranean diet ? Recommended ? Grains: Whole-grain pasta. Brown rice. Bulgar wheat. Polenta. Couscous. Whole-wheat bread. Orpah Cobb. ? Vegetables: Artichokes. Beets. Broccoli. Cabbage. Carrots. Eggplant. Green beans. Chard. Kale. Spinach. Onions. Leeks. Peas. Squash.  Tomatoes. Peppers. Radishes. ? Fruits: Apples. Apricots. Avocado. Berries. Bananas. Cherries. Dates. Figs. Grapes. Lemons. Melon. Oranges. Peaches. Plums. Pomegranate. ? Meats and other protein foods: Beans. Almonds. Sunflower seeds. Pine nuts. Peanuts. Cod. Salmon. Scallops. Shrimp. Tuna. Tilapia. Clams. Oysters. Eggs. ? Dairy: Low-fat milk. Cheese. Greek yogurt. ? Beverages: Water. Red wine. Herbal tea. ? Fats and oils: Extra virgin olive oil. Avocado oil. Grape seed oil. ? Sweets and desserts: [place] yogurt with honey. Baked apples. Poached pears. Trail mix. ? Seasoning and other foods: Basil. Cilantro. Coriander. Cumin. Mint. Parsley. Sage. Rosemary. Tarragon. Garlic. Oregano. Thyme. Pepper. Balsalmic vinegar. Tahini. Hummus. Tomato sauce. Olives. Mushrooms. ? Limit these ? Grains: Prepackaged pasta or rice dishes. Prepackaged cereal with added sugar. ? Vegetables: Deep fried potatoes (french fries). ? Fruits: Fruit canned in syrup. ? Meats and other protein foods: Beef. Pork. Lamb. Poultry with skin. Hot dogs. Tomasa Blase. ? Dairy: Ice cream. Sour cream. Whole milk. ? Beverages: Juice. Sugar-sweetened soft drinks. Beer. Liquor and spirits. ? Fats and oils: Butter. Canola oil. Vegetable oil. Beef fat (tallow). Lard. ? Sweets and desserts: Cookies. Cakes. Pies. Candy. ? Seasoning and other foods: Mayonnaise. Premade sauces and marinades. ? The items listed may not be a complete list. Talk with your dietitian about what dietary choices are right for you. Summary  The Mediterranean diet includes both food and lifestyle choices.  Eat a variety of fresh fruits and vegetables, beans, nuts, seeds, and whole grains.  Limit the amount of red meat and sweets that you eat.  Talk with your health care provider about whether it is safe for you to drink red wine in moderation. This means 1 glass a day for nonpregnant women and 2 glasses a day for men. A glass of wine equals 5 oz (150 mL). This information  is not intended to replace advice given to you by your health care provider. Make sure you discuss any questions you have with your health care provider. Document Released: 10/27/2015 Document Revised: 11/29/2015 Document Reviewed: 10/27/2015 Elsevier Interactive Patient Education  2018 [employer] Inc[employer]  Raynaud Phenomenon Raynaud phenomenon is a condition that affects the blood vessels (arteries) that carry blood to your fingers and toes. The arteries that supply blood to your ears or the tip of your nose might also be affected. Raynaud phenomenon causes the arteries to temporarily narrow. As a result, the flow of blood to the affected areas is temporarily decreased. This usually occurs in response to cold temperatures or stress. During an attack, the skin in the affected areas turns white. You may also feel tingling or numbness in those areas. Attacks usually last for only a brief period, and then the blood flow to the area returns to normal. In most cases, Raynaud phenomenon does not cause serious health problems. What are the causes? For many people with this condition, the cause is not known. Raynaud phenomenon is sometimes associated with other diseases, such as scleroderma or lupus. What increases the risk? Raynaud phenomenon can affect anyone, but it develops most often in people who are 20-40 years  old. It affects more females than males. What are the signs or symptoms? Symptoms of Raynaud phenomenon may occur when you are exposed to cold temperatures or when you have emotional stress. The symptoms may last for a few minutes or up to several hours. They usually affect your fingers but may also affect your toes, ears, or the tip of your nose. Symptoms may include:  Changes in skin color. The skin in the affected areas will turn pale or white. The skin may then change from white to bluish to red as normal blood flow returns to the area.  Numbness, tingling, or pain in the affected areas.  In  severe cases, sores may develop in the affected areas. How is this diagnosed? Your health care provider will do a physical exam and take your medical history. You may be asked to put your hands in cold water to check for a reaction to cold temperature. Blood tests may be done to check for other diseases or conditions. Your health care provider may also order a test to check the movement of blood through your arteries and veins (vascular ultrasound). How is this treated? Treatment often involves making lifestyle changes and taking steps to control your exposure to cold temperatures. For more severe cases, medicine (calcium channel blockers) may be used to improve blood flow. Surgery is sometimes done to block the nerves that control the affected arteries, but this is rare. Follow these instructions at home:  Avoid exposure to cold by taking these steps: ? If possible, stay indoors during cold weather. ? When you go outside during cold weather, dress in layers and wear mittens, a hat, a scarf, and warm footwear. ? Wear mittens or gloves when handling ice or frozen food. ? Use holders for glasses or cans containing cold drinks. ? Let warm water run for a while before taking a shower or bath. ? Warm up the car before driving in cold weather.  If possible, avoid stressful and emotional situations. Exercise, meditation, and yoga may help you cope with stress. Biofeedback may be useful.  Do not use any tobacco products, including cigarettes, chewing tobacco, or electronic cigarettes. If you need help quitting, ask your health care provider.  Avoid secondhand smoke.  Limit your use of caffeine. Switch to decaffeinated coffee, tea, and soda. Avoid chocolate.  Wear loose fitting socks and comfortable, roomy shoes.  Avoid vibrating tools and machinery.  Take medicines only as directed by your health care provider. Contact a health care provider if:  Your discomfort becomes worse despite lifestyle  changes.  You develop sores on your fingers or toes that do not heal.  Your fingers or toes turn black.  You have breaks in the skin on your fingers or toes.  You have a fever.  You have pain or swelling in your joints.  You have a rash.  Your symptoms occur on only one side of your body. This information is not intended to replace advice given to you by your health care provider. Make sure you discuss any questions you have with your health care provider. Document Released: 03/02/2000 Document Revised: 08/11/2015 Document Reviewed: 09/07/2015 Elsevier Interactive Patient Education  2017 [employer]  Continue all medications as directed. Increase water intake, strive for at least 75 oz/day. Continue to avoid any tobacco products! Congratulations on your college graduation, a great job is out there somewhere! Recommend annual physicals. Have a good time at the beach! NICE TO SEE YOU!

## 2017-10-03 ENCOUNTER — Encounter: Payer: Self-pay | Admitting: Adult Health

## 2017-10-09 ENCOUNTER — Ambulatory Visit (INDEPENDENT_AMBULATORY_CARE_PROVIDER_SITE_OTHER): Payer: 59 | Admitting: Psychology

## 2017-10-09 DIAGNOSIS — F410 Panic disorder [episodic paroxysmal anxiety] without agoraphobia: Secondary | ICD-10-CM | POA: Diagnosis not present

## 2017-10-18 ENCOUNTER — Ambulatory Visit (INDEPENDENT_AMBULATORY_CARE_PROVIDER_SITE_OTHER): Payer: 59 | Admitting: Psychology

## 2017-10-18 DIAGNOSIS — F410 Panic disorder [episodic paroxysmal anxiety] without agoraphobia: Secondary | ICD-10-CM

## 2017-11-01 ENCOUNTER — Ambulatory Visit (INDEPENDENT_AMBULATORY_CARE_PROVIDER_SITE_OTHER): Payer: 59 | Admitting: Psychology

## 2017-11-01 DIAGNOSIS — F410 Panic disorder [episodic paroxysmal anxiety] without agoraphobia: Secondary | ICD-10-CM | POA: Diagnosis not present

## 2017-11-15 ENCOUNTER — Ambulatory Visit (INDEPENDENT_AMBULATORY_CARE_PROVIDER_SITE_OTHER): Payer: 59 | Admitting: Psychology

## 2017-11-15 DIAGNOSIS — F410 Panic disorder [episodic paroxysmal anxiety] without agoraphobia: Secondary | ICD-10-CM | POA: Diagnosis not present

## 2017-11-29 ENCOUNTER — Ambulatory Visit: Payer: 59 | Admitting: Psychology

## 2017-12-02 ENCOUNTER — Ambulatory Visit: Payer: Self-pay | Admitting: Psychology

## 2018-01-01 ENCOUNTER — Ambulatory Visit (INDEPENDENT_AMBULATORY_CARE_PROVIDER_SITE_OTHER): Payer: 59 | Admitting: Psychology

## 2018-01-01 DIAGNOSIS — F410 Panic disorder [episodic paroxysmal anxiety] without agoraphobia: Secondary | ICD-10-CM | POA: Diagnosis not present

## 2018-01-17 ENCOUNTER — Ambulatory Visit: Payer: 59 | Admitting: Psychology

## 2018-01-31 ENCOUNTER — Ambulatory Visit (INDEPENDENT_AMBULATORY_CARE_PROVIDER_SITE_OTHER): Payer: 59 | Admitting: Psychology

## 2018-01-31 DIAGNOSIS — F410 Panic disorder [episodic paroxysmal anxiety] without agoraphobia: Secondary | ICD-10-CM

## 2018-02-18 ENCOUNTER — Ambulatory Visit (INDEPENDENT_AMBULATORY_CARE_PROVIDER_SITE_OTHER): Payer: 59 | Admitting: Psychology

## 2018-02-18 DIAGNOSIS — F410 Panic disorder [episodic paroxysmal anxiety] without agoraphobia: Secondary | ICD-10-CM | POA: Diagnosis not present

## 2018-03-21 ENCOUNTER — Ambulatory Visit: Payer: 59 | Admitting: Psychology

## 2018-04-02 ENCOUNTER — Ambulatory Visit (INDEPENDENT_AMBULATORY_CARE_PROVIDER_SITE_OTHER): Payer: 59 | Admitting: Psychology

## 2018-04-02 DIAGNOSIS — F410 Panic disorder [episodic paroxysmal anxiety] without agoraphobia: Secondary | ICD-10-CM

## 2018-04-03 ENCOUNTER — Other Ambulatory Visit: Payer: Self-pay | Admitting: Orthopedic Surgery

## 2018-04-03 DIAGNOSIS — M25532 Pain in left wrist: Secondary | ICD-10-CM

## 2018-04-17 ENCOUNTER — Ambulatory Visit
Admission: RE | Admit: 2018-04-17 | Discharge: 2018-04-17 | Disposition: A | Discharge status: Home / Self Care | Payer: 59 | Source: Ambulatory Visit | Attending: Orthopedic Surgery | Admitting: Orthopedic Surgery

## 2018-04-17 DIAGNOSIS — M25532 Pain in left wrist: Secondary | ICD-10-CM

## 2018-04-22 NOTE — Progress Notes (Addendum)
Subjective:    Patient ID: Kimberly Solomon, female    DOB: Jul 30, 1995, 23 y.o.   MRN: [id number]  HPI: Kimberly Solomon presents with right lower rib pain that started 9 days ago. She denies acute trauma/injury prior to onset of sx's She reports pain will last 1-1010mins, rated 8/10 and described as "sharp, shooting". She has been using "breathing techniques" to get through pain. She denies cardiac CP/dyspea/palpitations/dizziness/HA She denies first degree family members with any cardiac disease/disorder She denies abdominal pain She denies fever/night sweats/chills/N/V/D She denies changes in bowel/bladder habits She denies recent travel outside of [place] or eating anywhere unusual She took one dose of OTC Motrin last night for L wrist pain, current temp 98.7857f oral She reports pain will occur when sitting or lying down She denies pain in relation to eating She denies pain with exertion, but reports pain worsening with inspiration She has pre-existing asthma that is fairly well controlled with inhalers and oral anti-histamine She has pre-existing GAD- treated with exercise and Hydroxyzine 50mg  1-2 tabs PRN QD She is seen by psychology monthly She is followed by Psychiatry [facility] for ADHD- has been on Amphetamine ER 12.5mg  QD for >1 year HR today upon initial check was 127 HR at July 2019 OV 89 HR at Jan 2019 OV 120 No other HRs listed in system from other [facility] 06/14/2015 TSH 0.65, elevated HR may be r/t to hyperthyroidism- thyroid panel ordered today  Patient Care Team    Relationship Specialty Notifications Start End  Julaine Fusianford, Noble Cicalese D, NP PCP - General Family Medicine  04/09/17   Alena BillsLittle, Edgar, MD Pediatrician Pediatrics  05/31/11    Comment: Dr. Clarene DukeLittle is patient's current provider  Irena CordsVan Winkle, Enzo Montgomeryobert C, MD Consulting Physician Allergy and Immunology  04/09/17   Pati GalloKramer, James, MD Consulting Physician Sports Medicine  04/09/17   Cherlyn RobertsLupton, Frederick, MD Consulting Physician Dermatology   04/09/17   Mitchel HonourMorris, Megan, DO Consulting Physician Obstetrics and Gynecology  04/09/17   Blima LedgerMiller, Sally, OD  Optometry  04/09/17   Christia ReadingBates, Dwight, MD Consulting Physician Otolaryngology  04/09/17     Patient Active Problem List   Diagnosis Date Noted  . Tachycardia 04/23/2018  . Chest pain 04/23/2018  . Rib pain on right side 04/23/2018  . Low TSH level 04/23/2018  . Asthma 10/02/2017  . Healthcare maintenance 04/09/2017  . Knee pain, chronic 09/04/2016  . Anorexia nervosa 09/27/2015  . Social anxiety disorder 09/27/2015  . GAD (generalized anxiety disorder) 09/27/2015  . Panic disorder without agoraphobia 09/27/2015  . Attention deficit hyperactivity disorder (ADHD), combined type 09/27/2015  . 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 Transient alteration of awareness 06/23/2015  . Depression 06/23/2015  . Anxiety 06/23/2015  . Eating disorder 04/16/2011     Past Medical History:  Diagnosis Date  . ADHD (attention deficit hyperactivity disorder)   . Allergy   . Anxiety   . Asthma   . Depression   . Eating disorder   . Mental disorder      Past Surgical History:  Procedure Laterality Date  . ADENOIDECTOMY  1999  . KNEE SURGERY Left   . TYMPANOTOMY  1999  . WISDOM TOOTH EXTRACTION  2015     Family History  Problem Relation Age of Onset  . Migraines Mother   . Anxiety disorder Mother   . Depression Mother   . ADD / ADHD Father   . Diabetes Paternal Grandfather   . Hypertension Paternal Grandfather   .  Heart disease Paternal Grandfather   . Hyperlipidemia Paternal Grandfather   . Anxiety disorder Maternal Grandfather   . Depression Maternal Grandfather   . Drug abuse Maternal Grandfather   . Hyperlipidemia Maternal Grandfather   . Hypertension Maternal Grandfather   . Stroke Maternal Grandfather   . Heart disease Maternal Grandfather   . Anxiety disorder Maternal Grandmother   . Hyperlipidemia Paternal Grandmother   .  Suicidality Neg Hx      Social History   Substance and Sexual Activity  Drug Use No     Social History   Substance and Sexual Activity  Alcohol Use No  . Alcohol/week: 0.0 standard drinks  . Frequency: Never   Comment: rare- last drank summer of 2016- usually 1 drink per episode     Social History   Tobacco Use  Smoking Status Never Smoker  Smokeless Tobacco Never Used     Outpatient Encounter Medications as of 04/23/2018  Medication Sig  . Acetaminophen-Caff-Pyrilamine (MIDOL COMPLETE PO) Take 2 tablets by mouth 2 (two) times daily as needed.  Marland Kitchen. albuterol (PROVENTIL HFA;VENTOLIN HFA) 108 (90 BASE) MCG/ACT inhaler Inhale 2 puffs into the lungs every 6 (six) hours as needed for wheezing or shortness of breath.  . Amphetamine ER (ADZENYS XR-ODT) 12.5 MG TBED DIS ONE T PO D  . desvenlafaxine (PRISTIQ) 50 MG 24 hr tablet Take 50 mg by mouth daily.  . diphenhydrAMINE (BENADRYL) 25 MG tablet Take 25 mg by mouth every 6 (six) hours as needed.  Marland Kitchen. erythromycin with ethanol (EMGEL) 2 % gel Apply 1 application topically daily as needed.  . ferrous sulfate 325 (65 FE) MG tablet Take 325 mg by mouth daily with breakfast.  . fluticasone (FLONASE) 50 MCG/ACT nasal spray Place 1 spray into both nostrils daily.  . fluticasone furoate-vilanterol (BREO ELLIPTA) 100-25 MCG/INH AEPB TAKE 1 PUFF BY MOUTH EVERY DAY  . hydrOXYzine (VISTARIL) 50 MG capsule Take 50 mg by mouth daily. Takes 1 time up to 3 times daily as needed  . ibuprofen (ADVIL,MOTRIN) 200 MG tablet Take 400 mg by mouth every 6 (six) hours as needed for mild pain or moderate pain.  Marland Kitchen. loratadine (CLARITIN) 10 MG tablet Take 10 mg by mouth daily.  . meloxicam (MOBIC) 15 MG tablet Take 15 mg by mouth daily. with food  . norgestimate-ethinyl estradiol (ORTHO-CYCLEN,SPRINTEC,PREVIFEM) 0.25-35 MG-MCG tablet Take 1 tablet by mouth daily.  . [DISCONTINUED] Beclomethasone Dipropionate 80 MCG/ACT AERS Place 80 mcg into the nose. 2 puffs into  each nostril once daily as needed.  . [DISCONTINUED] ketotifen (ZADITOR) 0.025 % ophthalmic solution Place 1 drop into both eyes 2 (two) times daily as needed.   No facility-administered encounter medications on file as of 04/23/2018.     Allergies: Prozac [fluoxetine hcl]; Adhesive [tape]; Codeine; Latex; Mobic [meloxicam]; Neosporin [neomycin-bacitracin zn-polymyx]; Amoxicillin; and Penicillins  Body mass index is 21.79 kg/m.  Blood pressure 112/77, pulse (!) 122, temperature 98.6 F (37 C), temperature source Oral, height 5\' 7"  (1.702 m), weight 139 lb 1.6 oz (63.1 kg), last menstrual period 04/09/2018, SpO2 98 %.     Review of Systems  Constitutional: Negative for activity change, appetite change, chills, diaphoresis, fatigue, fever and unexpected weight change.  HENT: Negative for congestion.   Eyes: Negative for visual disturbance.  Respiratory: Negative for cough, chest tightness, shortness of breath, wheezing and stridor.        R lower Rib pain with inspiration   Cardiovascular: Negative for chest pain, palpitations and leg swelling.  Gastrointestinal: Negative for abdominal distention, abdominal pain, blood in stool, constipation, diarrhea, nausea and vomiting.  Endocrine: Negative for cold intolerance, heat intolerance, polydipsia, polyphagia and polyuria.  Genitourinary: Negative for difficulty urinating, dysuria, flank pain, hematuria, menstrual problem and pelvic pain.  Musculoskeletal: Negative for arthralgias, back pain, gait problem, joint swelling, myalgias, neck pain and neck stiffness.  Skin: Negative for color change, pallor, rash and wound.  Neurological: Negative for dizziness, weakness and headaches.  Hematological: Does not bruise/bleed easily.  Psychiatric/Behavioral: Positive for decreased concentration and dysphoric mood. Negative for agitation, behavioral problems, confusion, hallucinations, self-injury, sleep disturbance and suicidal ideas. The patient is  nervous/anxious and is hyperactive.        Objective:   Physical Exam Constitutional:      General: She is not in acute distress.    Appearance: She is normal weight. She is not ill-appearing, toxic-appearing or diaphoretic.  HENT:     Head: Normocephalic and atraumatic.  Eyes:     Extraocular Movements: Extraocular movements intact.     Pupils: Pupils are equal, round, and reactive to light.  Cardiovascular:     Rate and Rhythm: Tachycardia present.  No extrasystoles are present.    Heart sounds: Normal heart sounds. No friction rub.  Pulmonary:     Effort: Pulmonary effort is normal.     Breath sounds: Normal breath sounds.  Chest:     Chest wall: No mass, deformity, tenderness, crepitus or edema. There is no dullness to percussion.  Abdominal:     General: Bowel sounds are normal. There is no abdominal bruit.     Palpations: Abdomen is soft. There is no fluid wave, hepatomegaly, splenomegaly or mass.     Tenderness: There is no abdominal tenderness. There is no guarding or rebound.  Musculoskeletal: Normal range of motion.  Skin:    General: Skin is warm and dry.     Capillary Refill: Capillary refill takes less than 2 seconds.  Neurological:     Mental Status: She is alert.  Psychiatric:        Mood and Affect: Mood normal. Mood is not anxious.        Behavior: Behavior normal. Behavior is not agitated.       Assessment & Plan:   1. Chest pain, unspecified type   2. Tachycardia   3. Rib pain on right side   4. Low TSH level     Rib pain on right side We will call you when lab results are available. EKG today- Normal Sinus Rhythm, which is perfect! CXR-IMPRESSION: No acute abnormality of the lungs. No radiographic findings to explain right-sided rib pain. Continue with your Behavioral Health care team, please focus on increase in stress due to your grandparents decline in health. Continue to drink plenty of water and follow heart healthy diet. Continue to  abstain from tobacco/vape use. Resume regular exercise as tolerated. Follow-up here in 3 months for complete physical, sooner if needed.  Tachycardia HR today upon initial check was 127 HR at July 2019 OV 89 HR at Jan 2019 OV 120 No other HRs listed in system from other [facility] 06/14/2015 TSH 0.65, elevated HR may be r/t to hyperthyroidism- thyroid panel ordered today EKG today- Normal Sinus Rhythm, which is perfect! We will call you when radiologist makes final read of your Chest Xray.   Low TSH level HR today upon initial check was 127, recheck 122 HR at July 2019 OV 89 HR at Jan 2019 OV 120 No other  HRs listed in system from other [facility] 06/14/2015 TSH 0.65, elevated HR may be r/t to hyperthyroidism- thyroid panel ordered today  Pt was in the office today for 50+ minutes, I spent >75% of time in face to face counseling of various medical concerns and in coordination of care  FOLLOW-UP:  Return in about 3 months (around 07/22/2018) for CPE.

## 2018-04-23 ENCOUNTER — Encounter: Payer: Self-pay | Admitting: Adult Health

## 2018-04-23 ENCOUNTER — Ambulatory Visit: Payer: 59

## 2018-04-23 ENCOUNTER — Other Ambulatory Visit: Payer: Self-pay | Admitting: Adult Health

## 2018-04-23 ENCOUNTER — Ambulatory Visit (INDEPENDENT_AMBULATORY_CARE_PROVIDER_SITE_OTHER): Payer: 59 | Admitting: Adult Health

## 2018-04-23 VITALS — BP 112/77 | HR 122 | Temp 98.6°F | Ht 67.0 in | Wt 139.1 lb

## 2018-04-23 DIAGNOSIS — R0781 Pleurodynia: Secondary | ICD-10-CM | POA: Diagnosis not present

## 2018-04-23 DIAGNOSIS — R079 Chest pain, unspecified: Secondary | ICD-10-CM | POA: Diagnosis not present

## 2018-04-23 DIAGNOSIS — R7989 Other specified abnormal findings of blood chemistry: Secondary | ICD-10-CM | POA: Diagnosis not present

## 2018-04-23 DIAGNOSIS — R000 Tachycardia, unspecified: Secondary | ICD-10-CM | POA: Diagnosis not present

## 2018-04-23 NOTE — Assessment & Plan Note (Addendum)
We will call you when lab results are available. EKG today- Normal Sinus Rhythm, which is perfect! CXR-IMPRESSION: No acute abnormality of the lungs. No radiographic findings to explain right-sided rib pain. Continue with your Behavioral Health care team, please focus on increase in stress due to your grandparents decline in health. Continue to drink plenty of water and follow heart healthy diet. Continue to abstain from tobacco/vape use. Resume regular exercise as tolerated. Follow-up here in 3 months for complete physical, sooner if needed.

## 2018-04-23 NOTE — Assessment & Plan Note (Signed)
HR today upon initial check was 127 HR at July 2019 OV 89 HR at Jan 2019 OV 120 No other HRs listed in system from other [facility] 06/14/2015 TSH 0.65, elevated HR may be r/t to hyperthyroidism- thyroid panel ordered today EKG today- Normal Sinus Rhythm, which is perfect! We will call you when radiologist makes final read of your Chest Xray.

## 2018-04-23 NOTE — Patient Instructions (Addendum)
Chest Wall Pain Chest wall pain is pain in or around the bones and muscles of your chest. Sometimes, an injury causes this pain. Excessive coughing or overuse of arm and chest muscles may also cause chest wall pain. Sometimes, the cause may not be known. This pain may take several weeks or longer to get better. Follow these instructions at home: Managing pain, stiffness, and swelling   If directed, put ice on the painful area: ? Put ice in a plastic bag. ? Place a towel between your skin and the bag. ? Leave the ice on for 20 minutes, 2-3 times per day. Activity  Rest as told by your health care provider.  Avoid activities that cause pain. These include any activities that use your chest muscles or your abdominal and side muscles to lift heavy items. Ask your health care provider what activities are safe for you. General instructions   Take over-the-counter and prescription medicines only as told by your health care provider.  Do not use any products that contain nicotine or tobacco, such as cigarettes, e-cigarettes, and chewing tobacco. These can delay healing after injury. If you need help quitting, ask your health care provider.  Keep all follow-up visits as told by your health care provider. This is important. Contact a health care provider if:  You have a fever.  Your chest pain becomes worse.  You have new symptoms. Get help right away if:  You have nausea or vomiting.  You feel sweaty or light-headed.  You have a cough with mucus from your lungs (sputum) or you cough up blood.  You develop shortness of breath. These symptoms may represent a serious problem that is an emergency. Do not wait to see if the symptoms will go away. Get medical help right away. Call your local emergency services (911 in the [place] Do not drive yourself to the hospital. Summary  Chest wall pain is pain in or around the bones and muscles of your chest.  Depending on the cause, it may be  treated with ice, rest, medicines, and avoiding activities that cause pain.  Contact a health care provider if you have a fever, worsening chest pain, or new symptoms.  Get help right away if you feel light-headed or you develop shortness of breath. These symptoms may be an emergency. This information is not intended to replace advice given to you by your health care provider. Make sure you discuss any questions you have with your health care provider. Document Released: 03/05/2005 Document Revised: 09/05/2017 Document Reviewed: 09/05/2017 Elsevier Interactive Patient Education  2019 [employer].  We will call you when lab results are available. EKG today- Normal Sinus Rhythm, which is perfect! We will call you when radiologist makes final read of your Chest Xray. Continue with your Behavioral Health care team, please focus on increase in stress due to your grandparents decline in health. Continue to drink plenty of water and follow heart healthy diet. Continue to abstain from tobacco/vape use. Resume regular exercise as tolerated. Follow-up here in 3 months for complete physical, sooner if needed. FEEL BETTER!

## 2018-04-23 NOTE — Assessment & Plan Note (Signed)
HR today upon initial check was 127, recheck 122 HR at July 2019 OV 89 HR at Jan 2019 OV 120 No other HRs listed in system from other [facility] 06/14/2015 TSH 0.65, elevated HR may be r/t to hyperthyroidism- thyroid panel ordered today

## 2018-04-24 LAB — COMPREHENSIVE METABOLIC PANEL
ALT: 14 IU/L (ref 0–32)
AST: 12 IU/L (ref 0–40)
Albumin/Globulin Ratio: 1.9 (ref 1.2–2.2)
Albumin: 4.4 g/dL (ref 3.9–5.0)
Alkaline Phosphatase: 50 IU/L (ref 39–117)
BUN/Creatinine Ratio: 13 (ref 9–23)
BUN: 13 mg/dL (ref 6–20)
Bilirubin Total: 0.7 mg/dL (ref 0.0–1.2)
CO2: 22 mmol/L (ref 20–29)
Calcium: 9.7 mg/dL (ref 8.7–10.2)
Chloride: 102 mmol/L (ref 96–106)
Creatinine, Ser: 1.02 mg/dL — ABNORMAL HIGH (ref 0.57–1.00)
GFR calc Af Amer: 90 mL/min/{1.73_m2} (ref >59)
GFR calc non Af Amer: 78 mL/min/{1.73_m2} (ref >59)
Globulin, Total: 2.3 g/dL (ref 1.5–4.5)
Glucose: 79 mg/dL (ref 65–99)
Potassium: 4.5 mmol/L (ref 3.5–5.2)
SODIUM: 140 mmol/L (ref 134–144)
TOTAL PROTEIN: 6.7 g/dL (ref 6.0–8.5)

## 2018-04-24 LAB — CBC WITH DIFFERENTIAL/PLATELET
Basophils Absolute: 0 10*3/uL (ref 0.0–0.2)
Basos: 1 %
EOS (ABSOLUTE): 0.1 10*3/uL (ref 0.0–0.4)
Eos: 2 %
Hematocrit: 40 % (ref 34.0–46.6)
Hemoglobin: 13.9 g/dL (ref 11.1–15.9)
IMMATURE GRANS (ABS): 0 10*3/uL (ref 0.0–0.1)
IMMATURE GRANULOCYTES: 1 %
Lymphocytes Absolute: 1.6 10*3/uL (ref 0.7–3.1)
Lymphs: 26 %
MCH: 29.1 pg (ref 26.6–33.0)
MCHC: 34.8 g/dL (ref 31.5–35.7)
MCV: 84 fL (ref 79–97)
Monocytes Absolute: 0.4 10*3/uL (ref 0.1–0.9)
Monocytes: 6 %
NEUTROS PCT: 64 %
Neutrophils Absolute: 4 10*3/uL (ref 1.4–7.0)
Platelets: 228 10*3/uL (ref 150–450)
RBC: 4.77 x10E6/uL (ref 3.77–5.28)
RDW: 12.4 % (ref 11.7–15.4)
WBC: 6.1 10*3/uL (ref 3.4–10.8)

## 2018-04-24 LAB — T4, FREE: Free T4: 1.18 ng/dL (ref 0.82–1.77)

## 2018-04-24 LAB — T3: T3, Total: 151 ng/dL (ref 71–180)

## 2018-04-24 LAB — TSH: TSH: 1.94 u[IU]/mL (ref 0.450–4.500)

## 2018-04-29 LAB — EKG 12-LEAD

## 2018-04-30 ENCOUNTER — Ambulatory Visit (INDEPENDENT_AMBULATORY_CARE_PROVIDER_SITE_OTHER): Payer: 59 | Admitting: Psychology

## 2018-04-30 DIAGNOSIS — F410 Panic disorder [episodic paroxysmal anxiety] without agoraphobia: Secondary | ICD-10-CM | POA: Diagnosis not present

## 2018-05-06 ENCOUNTER — Encounter: Payer: Self-pay | Admitting: Adult Health

## 2018-05-06 ENCOUNTER — Ambulatory Visit (INDEPENDENT_AMBULATORY_CARE_PROVIDER_SITE_OTHER): Payer: 59 | Admitting: Adult Health

## 2018-05-06 VITALS — BP 123/82 | HR 113 | Temp 98.7°F | Ht 67.0 in | Wt 141.8 lb

## 2018-05-06 DIAGNOSIS — R05 Cough: Secondary | ICD-10-CM

## 2018-05-06 DIAGNOSIS — R059 Cough, unspecified: Secondary | ICD-10-CM | POA: Insufficient documentation

## 2018-05-06 MED ORDER — MONTELUKAST SODIUM 10 MG PO TABS: 10.0000 mg | ORAL_TABLET | Freq: Every day | ORAL | 3 refills | Status: DC

## 2018-05-06 MED ORDER — BENZONATATE 200 MG PO CAPS: 200.0000 mg | ORAL_CAPSULE | Freq: Two times a day (BID) | ORAL | 0 refills | Status: DC | PRN

## 2018-05-06 NOTE — Patient Instructions (Signed)
Cough, Adult  Coughing is a reflex that clears your throat and your airways. Coughing helps to heal and protect your lungs. It is normal to cough occasionally, but a cough that happens with other symptoms or lasts a long time may be a sign of a condition that needs treatment. A cough may last only 2-3 weeks (acute), or it may last longer than 8 weeks (chronic). What are the causes? Coughing is commonly caused by:  Breathing in substances that irritate your lungs.  A viral or bacterial respiratory infection.  Allergies.  Asthma.  Postnasal drip.  Smoking.  Acid backing up from the stomach into the esophagus (gastroesophageal reflux).  Certain medicines.  Chronic lung problems, including COPD (or rarely, lung cancer).  Other medical conditions such as heart failure. Follow these instructions at home: Pay attention to any changes in your symptoms. Take these actions to help with your discomfort:  Take medicines only as told by your health care provider. ? If you were prescribed an antibiotic medicine, take it as told by your health care provider. Do not stop taking the antibiotic even if you start to feel better. ? Talk with your health care provider before you take a cough suppressant medicine.  Drink enough fluid to keep your urine clear or pale yellow.  If the air is dry, use a cold steam vaporizer or humidifier in your bedroom or your home to help loosen secretions.  Avoid anything that causes you to cough at work or at home.  If your cough is worse at night, try sleeping in a semi-upright position.  Avoid cigarette smoke. If you smoke, quit smoking. If you need help quitting, ask your health care provider.  Avoid caffeine.  Avoid alcohol.  Rest as needed. Contact a health care provider if:  You have new symptoms.  You cough up pus.  Your cough does not get better after 2-3 weeks, or your cough gets worse.  You cannot control your cough with suppressant  medicines and you are losing sleep.  You develop pain that is getting worse or pain that is not controlled with pain medicines.  You have a fever.  You have unexplained weight loss.  You have night sweats. Get help right away if:  You cough up blood.  You have difficulty breathing.  Your heartbeat is very fast. This information is not intended to replace advice given to you by your health care provider. Make sure you discuss any questions you have with your health care provider. Document Released: 09/01/2010 Document Revised: 08/11/2015 Document Reviewed: 05/12/2014 Elsevier Interactive Patient Education  2019 [employer].   Continue current asthma medications. Please stop Claritin and start Singular instead. Increase fluids and continue to avoid tobacco/vape use. Please use Tessalon as needed. If your cough is still worsening into next week, please call clinic and we will send in Azithromycin. FEEL BETTER!

## 2018-05-06 NOTE — Assessment & Plan Note (Signed)
Continue current asthma medications. Please stop Claritin and start Singular instead. Increase fluids and continue to avoid tobacco/vape use. Please use Tessalon as needed. If your cough is still worsening into next week, please call clinic and we will send in Azithromycin.

## 2018-05-06 NOTE — Progress Notes (Signed)
Subjective:    Patient ID: Kimberly Solomon, female    DOB: 1995/04/09, 23 y.o.   MRN: [id number]  HPI: Kimberly Solomon presents with persistent, non productive cough, green/yellow nasal drainage, wheezing, and sore throat that began approx 2 days . She denies fever, night sweats, body aches, chills, N/V/D. She denies known exposure to influenza She reports compliance with asthma medication regime She estimates to drink >60 oz water/day and continues to abstain from tobacco/vape use  Patient Care Team    Relationship Specialty Notifications Start End  Julaine Fusi, NP PCP - General Family Medicine  04/09/17   Alena Bills, MD Pediatrician Pediatrics  05/31/11    Comment: Dr. Clarene Duke is patient's current provider  Irena Cords, Enzo Montgomery, MD Consulting Physician Allergy and Immunology  04/09/17   Pati Gallo, MD Consulting Physician Sports Medicine  04/09/17   Cherlyn Roberts, MD Consulting Physician Dermatology  04/09/17   Mitchel Honour, DO Consulting Physician Obstetrics and Gynecology  04/09/17   Blima Ledger, OD  Optometry  04/09/17   Christia Reading, MD Consulting Physician Otolaryngology  04/09/17     Patient Active Problem List   Diagnosis Date Noted  . Cough in adult 05/06/2018  . Tachycardia 04/23/2018  . Chest pain 04/23/2018  . Rib pain on right side 04/23/2018  . Low TSH level 04/23/2018  . Asthma 10/02/2017  . Healthcare maintenance 04/09/2017  . Knee pain, chronic 09/04/2016  . Anorexia nervosa 09/27/2015  . Social anxiety disorder 09/27/2015  . GAD (generalized anxiety disorder) 09/27/2015  . Panic disorder without agoraphobia 09/27/2015  . Attention deficit hyperactivity disorder (ADHD), combined type 09/27/2015  . 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 Transient alteration of awareness 06/23/2015  . Depression 06/23/2015  . Anxiety 06/23/2015  . Eating disorder 04/16/2011     Past Medical History:  Diagnosis Date  . ADHD  (attention deficit hyperactivity disorder)   . Allergy   . Anxiety   . Asthma   . Depression   . Eating disorder   . Mental disorder      Past Surgical History:  Procedure Laterality Date  . ADENOIDECTOMY  1999  . KNEE SURGERY Left   . TYMPANOTOMY  1999  . WISDOM TOOTH EXTRACTION  2015     Family History  Problem Relation Age of Onset  . Migraines Mother   . Anxiety disorder Mother   . Depression Mother   . ADD / ADHD Father   . Diabetes Paternal Grandfather   . Hypertension Paternal Grandfather   . Heart disease Paternal Grandfather   . Hyperlipidemia Paternal Grandfather   . Anxiety disorder Maternal Grandfather   . Depression Maternal Grandfather   . Drug abuse Maternal Grandfather   . Hyperlipidemia Maternal Grandfather   . Hypertension Maternal Grandfather   . Stroke Maternal Grandfather   . Heart disease Maternal Grandfather   . Anxiety disorder Maternal Grandmother   . Hyperlipidemia Paternal Grandmother   . Suicidality Neg Hx      Social History   Substance and Sexual Activity  Drug Use No     Social History   Substance and Sexual Activity  Alcohol Use No  . Alcohol/week: 0.0 standard drinks  . Frequency: Never   Comment: rare- last drank summer of 2016- usually 1 drink per episode     Social History   Tobacco Use  Smoking Status Never Smoker  Smokeless Tobacco Never Used     Outpatient Encounter Medications as of  05/06/2018  Medication Sig  . Acetaminophen-Caff-Pyrilamine (MIDOL COMPLETE PO) Take 2 tablets by mouth 2 (two) times daily as needed.  Marland Kitchen albuterol (PROVENTIL HFA;VENTOLIN HFA) 108 (90 BASE) MCG/ACT inhaler Inhale 2 puffs into the lungs every 6 (six) hours as needed for wheezing or shortness of breath.  . Amphetamine ER (ADZENYS XR-ODT) 12.5 MG TBED DIS ONE T PO D  . desvenlafaxine (PRISTIQ) 50 MG 24 hr tablet Take 50 mg by mouth daily.  . diphenhydrAMINE (BENADRYL) 25 MG tablet Take 25 mg by mouth every 6 (six) hours as  needed.  Marland Kitchen erythromycin with ethanol (EMGEL) 2 % gel Apply 1 application topically daily as needed.  . ferrous sulfate 325 (65 FE) MG tablet Take 325 mg by mouth daily with breakfast.  . fluticasone (FLONASE) 50 MCG/ACT nasal spray Place 1 spray into both nostrils daily.  . fluticasone furoate-vilanterol (BREO ELLIPTA) 100-25 MCG/INH AEPB TAKE 1 PUFF BY MOUTH EVERY DAY  . hydrOXYzine (VISTARIL) 50 MG capsule Take 50 mg by mouth daily. Takes 1 time up to 3 times daily as needed  . ibuprofen (ADVIL,MOTRIN) 200 MG tablet Take 400 mg by mouth every 6 (six) hours as needed for mild pain or moderate pain.  . norgestimate-ethinyl estradiol (ORTHO-CYCLEN,SPRINTEC,PREVIFEM) 0.25-35 MG-MCG tablet Take 1 tablet by mouth daily.  . [DISCONTINUED] loratadine (CLARITIN) 10 MG tablet Take 10 mg by mouth daily.  . benzonatate (TESSALON) 200 MG capsule Take 1 capsule (200 mg total) by mouth 2 (two) times daily as needed for cough.  . montelukast (SINGULAIR) 10 MG tablet Take 1 tablet (10 mg total) by mouth at bedtime.  . [DISCONTINUED] meloxicam (MOBIC) 15 MG tablet Take 15 mg by mouth daily. with food   No facility-administered encounter medications on file as of 05/06/2018.     Allergies: Prozac [fluoxetine hcl]; Adhesive [tape]; Codeine; Latex; Mobic [meloxicam]; Neosporin [neomycin-bacitracin zn-polymyx]; Amoxicillin; and Penicillins  Body mass index is 22.21 kg/m.  Blood pressure 123/82, pulse (!) 113, temperature 98.7 F (37.1 C), temperature source Oral, height 5\' 7"  (1.702 m), weight 141 lb 12.8 oz (64.3 kg), last menstrual period 04/09/2018, SpO2 99 %.  Review of Systems  Constitutional: Positive for fatigue. Negative for activity change, appetite change, chills, diaphoresis, fever and unexpected weight change.  HENT: Positive for congestion, postnasal drip and rhinorrhea. Negative for ear discharge, ear pain, sinus pressure, sinus pain, sore throat, trouble swallowing and voice change.   Eyes:  Negative for visual disturbance.  Respiratory: Positive for cough, shortness of breath and wheezing. Negative for chest tightness.   Cardiovascular: Negative for chest pain, palpitations and leg swelling.  Gastrointestinal: Negative for abdominal distention, abdominal pain, blood in stool, constipation, diarrhea, nausea and vomiting.  Endocrine: Negative for cold intolerance, heat intolerance, polydipsia, polyphagia and polyuria.  Genitourinary: Negative for difficulty urinating and flank pain.  Musculoskeletal: Negative for myalgias.  Neurological: Negative for dizziness and headaches.  Hematological: Does not bruise/bleed easily.       Objective:   Physical Exam Vitals signs and nursing note reviewed.  Constitutional:      General: She is not in acute distress.    Appearance: She is not ill-appearing, toxic-appearing or diaphoretic.  HENT:     Head: Normocephalic and atraumatic.     Right Ear: Tympanic membrane, ear canal and external ear normal. There is no impacted cerumen.     Left Ear: Tympanic membrane, ear canal and external ear normal. There is no impacted cerumen.     Nose: Congestion and rhinorrhea present.  Mouth/Throat:     Pharynx: No oropharyngeal exudate.  Eyes:     Extraocular Movements: Extraocular movements intact.     Conjunctiva/sclera: Conjunctivae normal.     Pupils: Pupils are equal, round, and reactive to light.  Neck:     Musculoskeletal: Normal range of motion.  Cardiovascular:     Rate and Rhythm: Tachycardia present.     Pulses: Normal pulses.     Heart sounds: Normal heart sounds. No murmur. No friction rub. No gallop.   Pulmonary:     Effort: Pulmonary effort is normal. No respiratory distress.     Breath sounds: Normal breath sounds. No stridor. No wheezing, rhonchi or rales.  Chest:     Chest wall: No tenderness.  Skin:    Capillary Refill: Capillary refill takes less than 2 seconds.  Neurological:     Mental Status: She is alert and  oriented to person, place, and time.  Psychiatric:        Mood and Affect: Mood normal.        Behavior: Behavior normal.        Thought Content: Thought content normal.        Judgment: Judgment normal.       Assessment & Plan:   1. Cough in adult     Cough in adult Continue current asthma medications. Please stop Claritin and start Singular instead. Increase fluids and continue to avoid tobacco/vape use. Please use Tessalon as needed. If your cough is still worsening into next week, please call clinic and we will send in Azithromycin.    FOLLOW-UP:  Return if symptoms worsen or fail to improve.

## 2018-05-12 ENCOUNTER — Ambulatory Visit (INDEPENDENT_AMBULATORY_CARE_PROVIDER_SITE_OTHER): Payer: 59 | Admitting: Psychology

## 2018-05-12 DIAGNOSIS — F410 Panic disorder [episodic paroxysmal anxiety] without agoraphobia: Secondary | ICD-10-CM

## 2018-05-30 ENCOUNTER — Ambulatory Visit (INDEPENDENT_AMBULATORY_CARE_PROVIDER_SITE_OTHER): Payer: 59 | Admitting: Psychology

## 2018-05-30 ENCOUNTER — Other Ambulatory Visit: Payer: Self-pay

## 2018-05-30 DIAGNOSIS — F410 Panic disorder [episodic paroxysmal anxiety] without agoraphobia: Secondary | ICD-10-CM

## 2018-06-16 ENCOUNTER — Ambulatory Visit: Payer: 59 | Admitting: Psychology

## 2018-07-21 ENCOUNTER — Encounter: Payer: Self-pay | Admitting: Adult Health

## 2018-08-25 ENCOUNTER — Other Ambulatory Visit: Payer: Self-pay | Admitting: Adult Health

## 2018-08-25 ENCOUNTER — Telehealth: Payer: Self-pay

## 2018-08-25 NOTE — Telephone Encounter (Signed)
Please call pt to schedule CPE.  No further refills until pt is seen.  T. Evoleht Hovatter, CMA 

## 2018-09-25 ENCOUNTER — Other Ambulatory Visit: Payer: Self-pay | Admitting: Adult Health

## 2018-09-29 ENCOUNTER — Telehealth: Payer: Self-pay

## 2018-09-29 NOTE — Telephone Encounter (Signed)
Please call pt to schedule CPE.  No further refills until pt is seen.  T. Brieanna Nau, CMA 

## 2018-10-13 ENCOUNTER — Telehealth: Payer: Self-pay | Admitting: Adult Health

## 2018-10-13 MED ORDER — MONTELUKAST SODIUM 10 MG PO TABS: ORAL_TABLET | ORAL | 0 refills | Status: DC

## 2018-10-13 NOTE — Telephone Encounter (Signed)
Pt's mom called to request just enough Rx to get pt to 8/25 OV ---- Or patient states will do a TELEHEALTH w/provider if necessary for refill of :   montelukast (SINGULAIR) 10 MG tablet [[id number]]   Order Details Dose, Route, Frequency: As Directed  Dispense Quantity: 15 tablet Refills: 0 Fills remaining: --        Sig: TAKE 1 TABLET BY MOUTH AT BEDTIME. PATIENT MUST HAVE OFFICE VISIT PRIOR TO ANY FURTHER REFILLS     Per mom send refill order to :  [facility], [place] [phone] (Phone) [phone] (Fax)   --glh

## 2018-11-11 ENCOUNTER — Other Ambulatory Visit: Payer: Self-pay

## 2018-11-11 ENCOUNTER — Encounter: Payer: Self-pay | Admitting: Adult Health

## 2018-11-11 ENCOUNTER — Ambulatory Visit (INDEPENDENT_AMBULATORY_CARE_PROVIDER_SITE_OTHER): Payer: 59 | Admitting: Adult Health

## 2018-11-11 VITALS — BP 105/73 | HR 118 | Temp 98.7°F | Ht 66.0 in | Wt 140.9 lb

## 2018-11-11 DIAGNOSIS — F411 Generalized anxiety disorder: Secondary | ICD-10-CM

## 2018-11-11 DIAGNOSIS — R000 Tachycardia, unspecified: Secondary | ICD-10-CM

## 2018-11-11 DIAGNOSIS — R079 Chest pain, unspecified: Secondary | ICD-10-CM | POA: Diagnosis not present

## 2018-11-11 DIAGNOSIS — Z0000 Encounter for general adult medical examination without abnormal findings: Secondary | ICD-10-CM | POA: Diagnosis not present

## 2018-11-11 NOTE — Assessment & Plan Note (Signed)
Recommend more frequent contact with established therapist

## 2018-11-11 NOTE — Assessment & Plan Note (Signed)
HR 118 Amphetamine ER (Aszenys XR-ODT) 12.5mg - possible cause Long standing GAD poss cause  Urgent Referral to cards placed today

## 2018-11-11 NOTE — Assessment & Plan Note (Signed)
Remain well hydrated, follow heart healthy diet. Urgent referral to Cardiology placed, re:  elevated heart rate and new onset of chest tightness. Since you have had recent onset of chest tightness with exertion- do not exercise or over-exert yourself until you are evaluated by Cardiology. If you notice any Red Flag symptoms (ie Severe chest pain/shortness of breath, nausea with with chest pain) seek immediate medical assistance. Continue to social distance and wear a mask when in public. Follow-up with your therapist. Follow-up in 3 months.

## 2018-11-11 NOTE — Progress Notes (Signed)
Subjective:    Patient ID: Kimberly Solomon, female    DOB: 1995/06/03, 23 y.o.   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 He is still living  04/2018- work up for tachycardia- at that time she was not experiencing CP/chest tightness with exertion EKG and CXR both stable  She has long-standing GAD She has not been in contact with her therapist in months She denies SI/HI  Healthcare Maintenance: PAP-UTD, last 05/22/17 Immunizations-UTD  Patient Care Team    Relationship Specialty Notifications Start End  William Hamburgeranford, Roshawn Lacina D, NP PCP - General Family Medicine  04/09/17   Alena BillsLittle, Edgar, MD Pediatrician Pediatrics  05/31/11    Comment: Dr. Clarene DukeLittle is patient's current provider  Irena CordsVan Winkle, Enzo Montgomeryobert C, MD Consulting Physician Allergy and Immunology  04/09/17   Pati GalloKramer, James, MD Consulting Physician Sports Medicine  04/09/17   Cherlyn RobertsLupton, Frederick, MD Consulting Physician Dermatology  04/09/17   Mitchel HonourMorris, Megan, DO Consulting Physician Obstetrics and Gynecology  04/09/17   Blima LedgerMiller, Sally, OD  Optometry  04/09/17   Christia ReadingBates, Dwight, MD Consulting Physician Otolaryngology  04/09/17     Patient Active Problem List   Diagnosis Date Noted  . Cough in adult 05/06/2018  . Tachycardia 04/23/2018  . Chest pain 04/23/2018  . Rib pain on right side 04/23/2018  . Low TSH level 04/23/2018  . Asthma 10/02/2017  . Healthcare maintenance 04/09/2017  . Knee pain, chronic 09/04/2016  . Anorexia nervosa 09/27/2015  . Social anxiety disorder 09/27/2015  . GAD (generalized anxiety disorder) 09/27/2015  . Panic disorder without agoraphobia 09/27/2015  . Attention deficit hyperactivity disorder (ADHD), combined type 09/27/2015  . MDD (major depressive  disorder), recurrent episode, mild (HCC) 09/27/2015  . Transient alteration of awareness 06/23/2015  . Depression 06/23/2015  . Anxiety 06/23/2015  . Eating disorder 04/16/2011     Past Medical History:  Diagnosis Date  . ADHD (attention deficit hyperactivity disorder)   . Allergy   . Anxiety   . Asthma   . Depression   . Eating disorder   . Mental disorder      Past Surgical History:  Procedure Laterality Date  . ADENOIDECTOMY  1999  . KNEE SURGERY Left   . TYMPANOTOMY  1999  . WISDOM TOOTH EXTRACTION  2015     Family History  Problem Relation Age of Onset  . Migraines Mother   . Anxiety disorder Mother   . Depression Mother   . ADD / ADHD Father   . Diabetes Paternal Grandfather   . Hypertension Paternal Grandfather   . Heart disease Paternal Grandfather   . Hyperlipidemia Paternal Grandfather   . Anxiety disorder Maternal Grandfather   . Depression Maternal Grandfather   . Drug abuse Maternal Grandfather   . Hyperlipidemia Maternal Grandfather   . Hypertension Maternal Grandfather   . Stroke Maternal Grandfather   . Heart disease Maternal Grandfather   . Anxiety disorder Maternal Grandmother   . Hyperlipidemia Paternal Grandmother   . Suicidality Neg Hx      Social History   Substance and Sexual Activity  Drug Use No     Social History   Substance and Sexual Activity  Alcohol Use No  . Alcohol/week: 0.0  standard drinks  . Frequency: Never   Comment: rare- last drank summer of 2016- usually 1 drink per episode     Social History   Tobacco Use  Smoking Status Never Smoker  Smokeless Tobacco Never Used     Outpatient Encounter Medications as of 11/11/2018  Medication Sig  . Acetaminophen-Caff-Pyrilamine (MIDOL COMPLETE PO) Take 2 tablets by mouth 2 (two) times daily as needed.  Marland Kitchen albuterol (PROVENTIL HFA;VENTOLIN HFA) 108 (90 BASE) MCG/ACT inhaler Inhale 2 puffs into the lungs every 6 (six) hours as needed for wheezing or shortness of  breath.  . Amphetamine ER (ADZENYS XR-ODT) 12.5 MG TBED DIS ONE T PO D  . desvenlafaxine (PRISTIQ) 50 MG 24 hr tablet Take 50 mg by mouth daily.  . diphenhydrAMINE (BENADRYL) 25 MG tablet Take 25 mg by mouth every 6 (six) hours as needed.  Marland Kitchen erythromycin with ethanol (EMGEL) 2 % gel Apply 1 application topically daily as needed.  . ferrous sulfate 325 (65 FE) MG tablet Take 325 mg by mouth daily with breakfast.  . fluticasone furoate-vilanterol (BREO ELLIPTA) 100-25 MCG/INH AEPB TAKE 1 PUFF BY MOUTH EVERY DAY  . hydrOXYzine (VISTARIL) 50 MG capsule Take 50 mg by mouth daily. Takes 1 time up to 3 times daily as needed  . ibuprofen (ADVIL,MOTRIN) 200 MG tablet Take 400 mg by mouth every 6 (six) hours as needed for mild pain or moderate pain.  . montelukast (SINGULAIR) 10 MG tablet TAKE 1 TABLET BY MOUTH AT BEDTIME. PATIENT MUST HAVE OFFICE VISIT PRIOR TO ANY FURTHER REFILLS  . norgestimate-ethinyl estradiol (ORTHO-CYCLEN,SPRINTEC,PREVIFEM) 0.25-35 MG-MCG tablet Take 1 tablet by mouth daily.  Norvel Richards 80 MCG/ACT AERS Place 1 spray into both nostrils daily.  . [DISCONTINUED] benzonatate (TESSALON) 200 MG capsule Take 1 capsule (200 mg total) by mouth 2 (two) times daily as needed for cough.  . [DISCONTINUED] fluticasone (FLONASE) 50 MCG/ACT nasal spray Place 1 spray into both nostrils daily.   No facility-administered encounter medications on file as of 11/11/2018.     Allergies: Prozac [fluoxetine hcl], Adhesive [tape], Codeine, Latex, Mobic [meloxicam], Neosporin [neomycin-bacitracin zn-polymyx], Amoxicillin, and Penicillins  Body mass index is 22.74 kg/m.  Blood pressure 105/73, pulse (!) 118, temperature 98.7 F (37.1 C), temperature source Oral, height 5\' 6"  (1.676 m), weight 140 lb 14.4 oz (63.9 kg), last menstrual period 10/22/2018.   Review of Systems  Constitutional: Negative for activity change, appetite change, chills, diaphoresis, fatigue, fever and unexpected weight change.   HENT: Negative for congestion.   Eyes: Negative for visual disturbance.  Respiratory: Positive for chest tightness. Negative for cough, shortness of breath, wheezing and stridor.   Cardiovascular: Negative for chest pain, palpitations and leg swelling.  Gastrointestinal: Negative for abdominal distention, anal bleeding, blood in stool, constipation, diarrhea, nausea and vomiting.  Endocrine: Negative for cold intolerance, heat intolerance, polydipsia, polyphagia and polyuria.  Genitourinary: Negative for difficulty urinating and flank pain.  Musculoskeletal: Negative for arthralgias, back pain, gait problem, joint swelling, myalgias, neck pain and neck stiffness.  Skin: Negative for color change, pallor, rash and wound.  Neurological: Negative for dizziness and headaches.  Hematological: Negative for adenopathy. Does not bruise/bleed easily.  Psychiatric/Behavioral: Negative for agitation, behavioral problems, confusion, decreased concentration, dysphoric mood, hallucinations, self-injury, sleep disturbance and suicidal ideas. The patient is nervous/anxious. The patient is not hyperactive.        Objective:   Physical Exam Vitals signs and nursing note reviewed.  Constitutional:      General: She is not  in acute distress.    Appearance: Normal appearance. She is normal weight. She is not ill-appearing, toxic-appearing or diaphoretic.  HENT:     Head: Normocephalic and atraumatic.     Right Ear: Tympanic membrane, ear canal and external ear normal.     Left Ear: Ear canal and external ear normal.     Nose: Nose normal. No congestion.     Mouth/Throat:     Mouth: Mucous membranes are moist.     Pharynx: No oropharyngeal exudate.  Eyes:     Extraocular Movements: Extraocular movements intact.     Conjunctiva/sclera: Conjunctivae normal.     Pupils: Pupils are equal, round, and reactive to light.  Neck:     Musculoskeletal: Normal range of motion and neck supple. No muscular  tenderness.  Cardiovascular:     Rate and Rhythm: Regular rhythm. Tachycardia present.     Pulses: Normal pulses.     Heart sounds: Normal heart sounds. No murmur. No friction rub. No gallop.   Pulmonary:     Effort: Pulmonary effort is normal. No respiratory distress.     Breath sounds: Normal breath sounds. No stridor. No wheezing, rhonchi or rales.  Chest:     Chest wall: No tenderness.  Abdominal:     General: Abdomen is flat. Bowel sounds are normal. There is no distension.     Palpations: There is no mass.     Tenderness: There is no abdominal tenderness. There is no right CVA tenderness, left CVA tenderness, guarding or rebound.     Hernia: No hernia is present.  Musculoskeletal: Normal range of motion.  Skin:    General: Skin is warm and dry.     Capillary Refill: Capillary refill takes less than 2 seconds.  Neurological:     Mental Status: She is alert and oriented to person, place, and time.     Coordination: Coordination normal.  Psychiatric:        Mood and Affect: Mood normal.        Behavior: Behavior normal.        Thought Content: Thought content normal.        Judgment: Judgment normal.        Assessment & Plan:   1. Tachycardia   2. Chest pain, unspecified type   3. Healthcare maintenance   4. GAD (generalized anxiety disorder)     Healthcare maintenance Remain well hydrated, follow heart healthy diet. Urgent referral to Cardiology placed, re:  elevated heart rate and new onset of chest tightness. Since you have had recent onset of chest tightness with exertion- do not exercise or over-exert yourself until you are evaluated by Cardiology. If you notice any Red Flag symptoms (ie Severe chest pain/shortness of breath, nausea with with chest pain) seek immediate medical assistance. Continue to social distance and wear a mask when in public. Follow-up with your therapist. Follow-up in 3 months.  Tachycardia HR 118 Amphetamine ER (Aszenys XR-ODT) 12.5mg -  possible cause Long standing GAD poss cause  Urgent Referral to cards placed today   GAD (generalized anxiety disorder) Recommend more frequent contact with established therapist     FOLLOW-UP:  Return in about 3 months (around 02/11/2019) for Regular Follow Up.

## 2018-11-11 NOTE — Patient Instructions (Signed)
Preventive Care for Adults, Female  A healthy lifestyle and preventive care can promote health and wellness. Preventive health guidelines for women include the following key practices.   A routine yearly physical is a good way to check with your health care provider about your health and preventive screening. It is a chance to share any concerns and updates on your health and to receive a thorough exam.   Visit your dentist for a routine exam and preventive care every 6 months. Brush your teeth twice a day and floss once a day. Good oral hygiene prevents tooth decay and gum disease.   The frequency of eye exams is based on your age, health, family medical history, use of contact lenses, and other factors. Follow your health care provider's recommendations for frequency of eye exams.   Eat a healthy diet. Foods like vegetables, fruits, whole grains, low-fat dairy products, and lean protein foods contain the nutrients you need without too many calories. Decrease your intake of foods high in solid fats, added sugars, and salt. Eat the right amount of calories for you.Get information about a proper diet from your health care provider, if necessary.   Regular physical exercise is one of the most important things you can do for your health. Most adults should get at least 150 minutes of moderate-intensity exercise (any activity that increases your heart rate and causes you to sweat) each week. In addition, most adults need muscle-strengthening exercises on 2 or more days a week.   Maintain a healthy weight. The body mass index (BMI) is a screening tool to identify possible weight problems. It provides an estimate of body fat based on height and weight. Your health care provider can find your BMI, and can help you achieve or maintain a healthy weight.For adults 20 years and older:   - A BMI below 18.5 is considered underweight.   - A BMI of 18.5 to 24.9 is normal.   - A BMI of 25 to 29.9 is  considered overweight.   - A BMI of 30 and above is considered obese.   Maintain normal blood lipids and cholesterol levels by exercising and minimizing your intake of trans and saturated fats.  Eat a balanced diet with plenty of fruit and vegetables. Blood tests for lipids and cholesterol should begin at age 75 and be repeated every 5 years minimum.  If your lipid or cholesterol levels are high, you are over 40, or you are at high risk for heart disease, you may need your cholesterol levels checked more frequently.Ongoing high lipid and cholesterol levels should be treated with medicines if diet and exercise are not working.   If you smoke, find out from your health care provider how to quit. If you do not use tobacco, do not start.   Lung cancer screening is recommended for adults aged 103-80 years who are at high risk for developing lung cancer because of a history of smoking. A yearly low-dose CT scan of the lungs is recommended for people who have at least a 30-pack-year history of smoking and are a current smoker or have quit within the past 15 years. A pack year of smoking is smoking an average of 1 pack of cigarettes a day for 1 year (for example: 1 pack a day for 30 years or 2 packs a day for 15 years). Yearly screening should continue until the smoker has stopped smoking for at least 15 years. Yearly screening should be stopped for people who develop a  health problem that would prevent them from having lung cancer treatment.   If you are pregnant, do not drink alcohol. If you are breastfeeding, be very cautious about drinking alcohol. If you are not pregnant and choose to drink alcohol, do not have more than 1 drink per day. One drink is considered to be 12 ounces (355 mL) of beer, 5 ounces (148 mL) of wine, or 1.5 ounces (44 mL) of liquor.   Avoid use of street drugs. Do not share needles with anyone. Ask for help if you need support or instructions about stopping the use of  drugs.   High blood pressure causes heart disease and increases the risk of stroke. Your blood pressure should be checked at least yearly.  Ongoing high blood pressure should be treated with medicines if weight loss and exercise do not work.   If you are 30-69 years old, ask your health care provider if you should take aspirin to prevent strokes.   Diabetes screening involves taking a blood sample to check your fasting blood sugar level. This should be done once every 3 years, after age 88, if you are within normal weight and without risk factors for diabetes. Testing should be considered at a younger age or be carried out more frequently if you are overweight and have at least 1 risk factor for diabetes.   Breast cancer screening is essential preventive care for women. You should practice "breast self-awareness."  This means understanding the normal appearance and feel of your breasts and may include breast self-examination.  Any changes detected, no matter how small, should be reported to a health care provider.  Women in their 84s and 30s should have a clinical breast exam (CBE) by a health care provider as part of a regular health exam every 1 to 3 years.  After age 65, women should have a CBE every year.  Starting at age 26, women should consider having a mammogram (breast X-ray test) every year.  Women who have a family history of breast cancer should talk to their health care provider about genetic screening.  Women at a high risk of breast cancer should talk to their health care providers about having an MRI and a mammogram every year.   -Breast cancer gene (BRCA)-related cancer risk assessment is recommended for women who have family members with BRCA-related cancers. BRCA-related cancers include breast, ovarian, tubal, and peritoneal cancers. Having family members with these cancers may be associated with an increased risk for harmful changes (mutations) in the breast cancer genes BRCA1 and  BRCA2. Results of the assessment will determine the need for genetic counseling and BRCA1 and BRCA2 testing.   The Pap test is a screening test for cervical cancer. A Pap test can show cell changes on the cervix that might become cervical cancer if left untreated. A Pap test is a procedure in which cells are obtained and examined from the lower end of the uterus (cervix).   - Women should have a Pap test starting at age 53.   - Between ages 49 and 58, Pap tests should be repeated every 2 years.   - Beginning at age 75, you should have a Pap test every 3 years as long as the past 3 Pap tests have been normal.   - Some women have medical problems that increase the chance of getting cervical cancer. Talk to your health care provider about these problems. It is especially important to talk to your health care provider if a  new problem develops soon after your last Pap test. In these cases, your health care provider may recommend more frequent screening and Pap tests.   - The above recommendations are the same for women who have or have not gotten the vaccine for human papillomavirus (HPV).   - If you had a hysterectomy for a problem that was not cancer or a condition that could lead to cancer, then you no longer need Pap tests. Even if you no longer need a Pap test, a regular exam is a good idea to make sure no other problems are starting.   - If you are between ages 36 and 66 years, and you have had normal Pap tests going back 10 years, you no longer need Pap tests. Even if you no longer need a Pap test, a regular exam is a good idea to make sure no other problems are starting.   - If you have had past treatment for cervical cancer or a condition that could lead to cancer, you need Pap tests and screening for cancer for at least 20 years after your treatment.   - If Pap tests have been discontinued, risk factors (such as a new sexual partner) need to be reassessed to determine if screening should  be resumed.   - The HPV test is an additional test that may be used for cervical cancer screening. The HPV test looks for the virus that can cause the cell changes on the cervix. The cells collected during the Pap test can be tested for HPV. The HPV test could be used to screen women aged 70 years and older, and should be used in women of any age who have unclear Pap test results. After the age of 67, women should have HPV testing at the same frequency as a Pap test.   Colorectal cancer can be detected and often prevented. Most routine colorectal cancer screening begins at the age of 57 years and continues through age 26 years. However, your health care provider may recommend screening at an earlier age if you have risk factors for colon cancer. On a yearly basis, your health care provider may provide home test kits to check for hidden blood in the stool.  Use of a small camera at the end of a tube, to directly examine the colon (sigmoidoscopy or colonoscopy), can detect the earliest forms of colorectal cancer. Talk to your health care provider about this at age 23, when routine screening begins. Direct exam of the colon should be repeated every 5 -10 years through age 49 years, unless early forms of pre-cancerous polyps or small growths are found.   People who are at an increased risk for hepatitis B should be screened for this virus. You are considered at high risk for hepatitis B if:  -You were born in a country where hepatitis B occurs often. Talk with your health care provider about which countries are considered high risk.  - Your parents were born in a high-risk country and you have not received a shot to protect against hepatitis B (hepatitis B vaccine).  - You have HIV or AIDS.  - You use needles to inject street drugs.  - You live with, or have sex with, someone who has Hepatitis B.  - You get hemodialysis treatment.  - You take certain medicines for conditions like cancer, organ  transplantation, and autoimmune conditions.   Hepatitis C blood testing is recommended for all people born from [birth year] through 1965 and any individual  with known risks for hepatitis C.   Practice safe sex. Use condoms and avoid high-risk sexual practices to reduce the spread of sexually transmitted infections (STIs). STIs include gonorrhea, chlamydia, syphilis, trichomonas, herpes, HPV, and human immunodeficiency virus (HIV). Herpes, HIV, and HPV are viral illnesses that have no cure. They can result in disability, cancer, and death. Sexually active women aged 25 years and younger should be checked for chlamydia. Older women with new or multiple partners should also be tested for chlamydia. Testing for other STIs is recommended if you are sexually active and at increased risk.   Osteoporosis is a disease in which the bones lose minerals and strength with aging. This can result in serious bone fractures or breaks. The risk of osteoporosis can be identified using a bone density scan. Women ages 65 years and over and women at risk for fractures or osteoporosis should discuss screening with their health care providers. Ask your health care provider whether you should take a calcium supplement or vitamin D to There are also several preventive steps women can take to avoid osteoporosis and resulting fractures or to keep osteoporosis from worsening. -->Recommendations include:  Eat a balanced diet high in fruits, vegetables, calcium, and vitamins.  Get enough calcium. The recommended total intake of is 1,200 mg daily; for best absorption, if taking supplements, divide doses into 250-500 mg doses throughout the day. Of the two types of calcium, calcium carbonate is best absorbed when taken with food but calcium citrate can be taken on an empty stomach.  Get enough vitamin D. NAMS and the National Osteoporosis Foundation recommend at least 1,000 IU per day for women age 50 and over who are at risk of vitamin D  deficiency. Vitamin D deficiency can be caused by inadequate sun exposure (for example, those who live in northern latitudes).  Avoid alcohol and smoking. Heavy alcohol intake (more than 7 drinks per week) increases the risk of falls and hip fracture and women smokers tend to lose bone more rapidly and have lower bone mass than nonsmokers. Stopping smoking is one of the most important changes women can make to improve their health and decrease risk for disease.  Be physically active every day. Weight-bearing exercise (for example, fast walking, hiking, jogging, and weight training) may strengthen bones or slow the rate of bone loss that comes with aging. Balancing and muscle-strengthening exercises can reduce the risk of falling and fracture.  Consider therapeutic medications. Currently, several types of effective drugs are available. Healthcare providers can recommend the type most appropriate for each woman.  Eliminate environmental factors that may contribute to accidents. Falls cause nearly 90% of all osteoporotic fractures, so reducing this risk is an important bone-health strategy. Measures include ample lighting, removing obstructions to walking, using nonskid rugs on floors, and placing mats and/or grab bars in showers.  Be aware of medication side effects. Some common medicines make bones weaker. These include a type of steroid drug called glucocorticoids used for arthritis and asthma, some antiseizure drugs, certain sleeping pills, treatments for endometriosis, and some cancer drugs. An overactive thyroid gland or using too much thyroid hormone for an underactive thyroid can also be a problem. If you are taking these medicines, talk to your doctor about what you can do to help protect your bones.reduce the rate of osteoporosis.    Menopause can be associated with physical symptoms and risks. Hormone replacement therapy is available to decrease symptoms and risks. You should talk to your  health care provider   about whether hormone replacement therapy is right for you.   Use sunscreen. Apply sunscreen liberally and repeatedly throughout the day. You should seek shade when your shadow is shorter than you. Protect yourself by wearing long sleeves, pants, a wide-brimmed hat, and sunglasses year round, whenever you are outdoors.   Once a month, do a whole body skin exam, using a mirror to look at the skin on your back. Tell your health care provider of new moles, moles that have irregular borders, moles that are larger than a pencil eraser, or moles that have changed in shape or color.   -Stay current with required vaccines (immunizations).   Influenza vaccine. All adults should be immunized every year.  Tetanus, diphtheria, and acellular pertussis (Td, Tdap) vaccine. Pregnant women should receive 1 dose of Tdap vaccine during each pregnancy. The dose should be obtained regardless of the length of time since the last dose. Immunization is preferred during the 27th 36th week of gestation. An adult who has not previously received Tdap or who does not know her vaccine status should receive 1 dose of Tdap. This initial dose should be followed by tetanus and diphtheria toxoids (Td) booster doses every 10 years. Adults with an unknown or incomplete history of completing a 3-dose immunization series with Td-containing vaccines should begin or complete a primary immunization series including a Tdap dose. Adults should receive a Td booster every 10 years.  Varicella vaccine. An adult without evidence of immunity to varicella should receive 2 doses or a second dose if she has previously received 1 dose. Pregnant females who do not have evidence of immunity should receive the first dose after pregnancy. This first dose should be obtained before leaving the health care facility. The second dose should be obtained 4 8 weeks after the first dose.  Human papillomavirus (HPV) vaccine. Females aged 13 26  years who have not received the vaccine previously should obtain the 3-dose series. The vaccine is not recommended for use in pregnant females. However, pregnancy testing is not needed before receiving a dose. If a female is found to be pregnant after receiving a dose, no treatment is needed. In that case, the remaining doses should be delayed until after the pregnancy. Immunization is recommended for any person with an immunocompromised condition through the age of 26 years if she did not get any or all doses earlier. During the 3-dose series, the second dose should be obtained 4 8 weeks after the first dose. The third dose should be obtained 24 weeks after the first dose and 16 weeks after the second dose.  Zoster vaccine. One dose is recommended for adults aged 60 years or older unless certain conditions are present.  Measles, mumps, and rubella (MMR) vaccine. Adults born before 1957 generally are considered immune to measles and mumps. Adults born in 1957 or later should have 1 or more doses of MMR vaccine unless there is a contraindication to the vaccine or there is laboratory evidence of immunity to each of the three diseases. A routine second dose of MMR vaccine should be obtained at least 28 days after the first dose for students attending postsecondary schools, health care workers, or international travelers. People who received inactivated measles vaccine or an unknown type of measles vaccine during 1963 1967 should receive 2 doses of MMR vaccine. People who received inactivated mumps vaccine or an unknown type of mumps vaccine before 1979 and are at high risk for mumps infection should consider immunization with 2 doses of   MMR vaccine. For females of childbearing age, rubella immunity should be determined. If there is no evidence of immunity, females who are not pregnant should be vaccinated. If there is no evidence of immunity, females who are pregnant should delay immunization until after pregnancy.  Unvaccinated health care workers born before [birth year] who lack laboratory evidence of measles, mumps, or rubella immunity or laboratory confirmation of disease should consider measles and mumps immunization with 2 doses of MMR vaccine or rubella immunization with 1 dose of MMR vaccine.  Pneumococcal 13-valent conjugate (PCV13) vaccine. When indicated, a person who is uncertain of her immunization history and has no record of immunization should receive the PCV13 vaccine. An adult aged 54 years or older who has certain medical conditions and has not been previously immunized should receive 1 dose of PCV13 vaccine. This PCV13 should be followed with a dose of pneumococcal polysaccharide (PPSV23) vaccine. The PPSV23 vaccine dose should be obtained at least 8 weeks after the dose of PCV13 vaccine. An adult aged 58 years or older who has certain medical conditions and previously received 1 or more doses of PPSV23 vaccine should receive 1 dose of PCV13. The PCV13 vaccine dose should be obtained 1 or more years after the last PPSV23 vaccine dose.  Pneumococcal polysaccharide (PPSV23) vaccine. When PCV13 is also indicated, PCV13 should be obtained first. All adults aged 58 years and older should be immunized. An adult younger than age 65 years who has certain medical conditions should be immunized. Any person who resides in a nursing home or long-term care facility should be immunized. An adult smoker should be immunized. People with an immunocompromised condition and certain other conditions should receive both PCV13 and PPSV23 vaccines. People with human immunodeficiency virus (HIV) infection should be immunized as soon as possible after diagnosis. Immunization during chemotherapy or radiation therapy should be avoided. Routine use of PPSV23 vaccine is not recommended for American Indians, [place] Natives, or people younger than 65 years unless there are medical conditions that require PPSV23 vaccine. When indicated,  people who have unknown immunization and have no record of immunization should receive PPSV23 vaccine. One-time revaccination 5 years after the first dose of PPSV23 is recommended for people aged 70 64 years who have chronic kidney failure, nephrotic syndrome, asplenia, or immunocompromised conditions. People who received 1 2 doses of PPSV23 before age 32 years should receive another dose of PPSV23 vaccine at age 96 years or later if at least 5 years have passed since the previous dose. Doses of PPSV23 are not needed for people immunized with PPSV23 at or after age 55 years.  Meningococcal vaccine. Adults with asplenia or persistent complement component deficiencies should receive 2 doses of quadrivalent meningococcal conjugate (MenACWY-D) vaccine. The doses should be obtained at least 2 months apart. Microbiologists working with certain meningococcal bacteria, [employer] recruits, people at risk during an outbreak, and people who travel to or live in countries with a high rate of meningitis should be immunized. A first-year college student up through age 58 years who is living in a residence hall should receive a dose if she did not receive a dose on or after her 16th birthday. Adults who have certain high-risk conditions should receive one or more doses of vaccine.  Hepatitis A vaccine. Adults who wish to be protected from this disease, have certain high-risk conditions, work with hepatitis A-infected animals, work in hepatitis A research labs, or travel to or work in countries with a high rate of hepatitis A should be  immunized. Adults who were previously unvaccinated and who anticipate close contact with an international adoptee during the first 60 days after arrival in the [place] from a country with a high rate of hepatitis A should be immunized.  Hepatitis B vaccine.  Adults who wish to be protected from this disease, have certain high-risk conditions, may be exposed to blood or other infectious  body fluids, are household contacts or sex partners of hepatitis B positive people, are clients or workers in certain care facilities, or travel to or work in countries with a high rate of hepatitis B should be immunized.  Haemophilus influenzae type b (Hib) vaccine. A previously unvaccinated person with asplenia or sickle cell disease or having a scheduled splenectomy should receive 1 dose of Hib vaccine. Regardless of previous immunization, a recipient of a hematopoietic stem cell transplant should receive a 3-dose series 6 12 months after her successful transplant. Hib vaccine is not recommended for adults with HIV infection.  Preventive Services / Frequency Ages 6 to 39years  Blood pressure check.** / Every 1 to 2 years.  Lipid and cholesterol check.** / Every 5 years beginning at age 39.  Clinical breast exam.** / Every 3 years for women in their 61s and 62s.  BRCA-related cancer risk assessment.** / For women who have family members with a BRCA-related cancer (breast, ovarian, tubal, or peritoneal cancers).  Pap test.** / Every 2 years from ages 47 through 85. Every 3 years starting at age 34 through age 12 or 74 with a history of 3 consecutive normal Pap tests.  HPV screening.** / Every 3 years from ages 46 through ages 43 to 54 with a history of 3 consecutive normal Pap tests.  Hepatitis C blood test.** / For any individual with known risks for hepatitis C.  Skin self-exam. / Monthly.  Influenza vaccine. / Every year.  Tetanus, diphtheria, and acellular pertussis (Tdap, Td) vaccine.** / Consult your health care provider. Pregnant women should receive 1 dose of Tdap vaccine during each pregnancy. 1 dose of Td every 10 years.  Varicella vaccine.** / Consult your health care provider. Pregnant females who do not have evidence of immunity should receive the first dose after pregnancy.  HPV vaccine. / 3 doses over 6 months, if 64 and younger. The vaccine is not recommended for use in  pregnant females. However, pregnancy testing is not needed before receiving a dose.  Measles, mumps, rubella (MMR) vaccine.** / You need at least 1 dose of MMR if you were born in 1957 or later. You may also need a 2nd dose. For females of childbearing age, rubella immunity should be determined. If there is no evidence of immunity, females who are not pregnant should be vaccinated. If there is no evidence of immunity, females who are pregnant should delay immunization until after pregnancy.  Pneumococcal 13-valent conjugate (PCV13) vaccine.** / Consult your health care provider.  Pneumococcal polysaccharide (PPSV23) vaccine.** / 1 to 2 doses if you smoke cigarettes or if you have certain conditions.  Meningococcal vaccine.** / 1 dose if you are age 71 to 37 years and a Market researcher living in a residence hall, or have one of several medical conditions, you need to get vaccinated against meningococcal disease. You may also need additional booster doses.  Hepatitis A vaccine.** / Consult your health care provider.  Hepatitis B vaccine.** / Consult your health care provider.  Haemophilus influenzae type b (Hib) vaccine.** / Consult your health care provider.  Ages 55 to 64years  Blood pressure check.** / Every 1 to 2 years.  Lipid and cholesterol check.** / Every 5 years beginning at age 26 years.  Lung cancer screening. / Every year if you are aged 6 80 years and have a 30-pack-year history of smoking and currently smoke or have quit within the past 15 years. Yearly screening is stopped once you have quit smoking for at least 15 years or develop a health problem that would prevent you from having lung cancer treatment.  Clinical breast exam.** / Every year after age 71 years.  BRCA-related cancer risk assessment.** / For women who have family members with a BRCA-related cancer (breast, ovarian, tubal, or peritoneal cancers).  Mammogram.** / Every year beginning at age 75  years and continuing for as long as you are in good health. Consult with your health care provider.  Pap test.** / Every 3 years starting at age 53 years through age 24 or 74 years with a history of 3 consecutive normal Pap tests.  HPV screening.** / Every 3 years from ages 24 years through ages 33 to 59 years with a history of 3 consecutive normal Pap tests.  Fecal occult blood test (FOBT) of stool. / Every year beginning at age 64 years and continuing until age 23 years. You may not need to do this test if you get a colonoscopy every 10 years.  Flexible sigmoidoscopy or colonoscopy.** / Every 5 years for a flexible sigmoidoscopy or every 10 years for a colonoscopy beginning at age 46 years and continuing until age 36 years.  Hepatitis C blood test.** / For all people born from [birth year] through 1965 and any individual with known risks for hepatitis C.  Skin self-exam. / Monthly.  Influenza vaccine. / Every year.  Tetanus, diphtheria, and acellular pertussis (Tdap/Td) vaccine.** / Consult your health care provider. Pregnant women should receive 1 dose of Tdap vaccine during each pregnancy. 1 dose of Td every 10 years.  Varicella vaccine.** / Consult your health care provider. Pregnant females who do not have evidence of immunity should receive the first dose after pregnancy.  Zoster vaccine.** / 1 dose for adults aged 21 years or older.  Measles, mumps, rubella (MMR) vaccine.** / You need at least 1 dose of MMR if you were born in 1957 or later. You may also need a 2nd dose. For females of childbearing age, rubella immunity should be determined. If there is no evidence of immunity, females who are not pregnant should be vaccinated. If there is no evidence of immunity, females who are pregnant should delay immunization until after pregnancy.  Pneumococcal 13-valent conjugate (PCV13) vaccine.** / Consult your health care provider.  Pneumococcal polysaccharide (PPSV23) vaccine.** / 1 to 2 doses if  you smoke cigarettes or if you have certain conditions.  Meningococcal vaccine.** / Consult your health care provider.  Hepatitis A vaccine.** / Consult your health care provider.  Hepatitis B vaccine.** / Consult your health care provider.  Haemophilus influenzae type b (Hib) vaccine.** / Consult your health care provider.  Ages 21 years and over  Blood pressure check.** / Every 1 to 2 years.  Lipid and cholesterol check.** / Every 5 years beginning at age 64 years.  Lung cancer screening. / Every year if you are aged 27 80 years and have a 30-pack-year history of smoking and currently smoke or have quit within the past 15 years. Yearly screening is stopped once you have quit smoking for at least 15 years or develop a health problem that  would prevent you from having lung cancer treatment.  Clinical breast exam.** / Every year after age 45 years.  BRCA-related cancer risk assessment.** / For women who have family members with a BRCA-related cancer (breast, ovarian, tubal, or peritoneal cancers).  Mammogram.** / Every year beginning at age 47 years and continuing for as long as you are in good health. Consult with your health care provider.  Pap test.** / Every 3 years starting at age 46 years through age 46 or 61 years with 3 consecutive normal Pap tests. Testing can be stopped between 65 and 70 years with 3 consecutive normal Pap tests and no abnormal Pap or HPV tests in the past 10 years.  HPV screening.** / Every 3 years from ages 83 years through ages 3 or 59 years with a history of 3 consecutive normal Pap tests. Testing can be stopped between 65 and 70 years with 3 consecutive normal Pap tests and no abnormal Pap or HPV tests in the past 10 years.  Fecal occult blood test (FOBT) of stool. / Every year beginning at age 81 years and continuing until age 72 years. You may not need to do this test if you get a colonoscopy every 10 years.  Flexible sigmoidoscopy or colonoscopy.** /  Every 5 years for a flexible sigmoidoscopy or every 10 years for a colonoscopy beginning at age 33 years and continuing until age 50 years.  Hepatitis C blood test.** / For all people born from [birth year] through 1965 and any individual with known risks for hepatitis C.  Osteoporosis screening.** / A one-time screening for women ages 25 years and over and women at risk for fractures or osteoporosis.  Skin self-exam. / Monthly.  Influenza vaccine. / Every year.  Tetanus, diphtheria, and acellular pertussis (Tdap/Td) vaccine.** / 1 dose of Td every 10 years.  Varicella vaccine.** / Consult your health care provider.  Zoster vaccine.** / 1 dose for adults aged 27 years or older.  Pneumococcal 13-valent conjugate (PCV13) vaccine.** / Consult your health care provider.  Pneumococcal polysaccharide (PPSV23) vaccine.** / 1 dose for all adults aged 44 years and older.  Meningococcal vaccine.** / Consult your health care provider.  Hepatitis A vaccine.** / Consult your health care provider.  Hepatitis B vaccine.** / Consult your health care provider.  Haemophilus influenzae type b (Hib) vaccine.** / Consult your health care provider. ** Family history and personal history of risk and conditions may change your health care provider's recommendations. Document Released: 05/01/2001 Document Revised: 12/24/2012  [facility] Patient Information 2014 [place], [place].   EXERCISE AND DIET:  We recommended that you start or continue a regular exercise program for good health. Regular exercise means any activity that makes your heart beat faster and makes you sweat.  We recommend exercising at least 30 minutes per day at least 3 days a week, preferably 5.  We also recommend a diet low in fat and sugar / carbohydrates.  Inactivity, poor dietary choices and obesity can cause diabetes, heart attack, stroke, and kidney damage, among others.     ALCOHOL AND SMOKING:  Women should limit their alcohol intake to no  more than 7 drinks/beers/glasses of wine (combined, not each!) per week. Moderation of alcohol intake to this level decreases your risk of breast cancer and liver damage.  ( And of course, no recreational drugs are part of a healthy lifestyle.)  Also, you should not be smoking at all or even being exposed to second hand smoke. Most people know smoking can  cause cancer, and various heart and lung diseases, but did you know it also contributes to weakening of your bones?  Aging of your skin?  Yellowing of your teeth and nails?   CALCIUM AND VITAMIN D:  Adequate intake of calcium and Vitamin D are recommended.  The recommendations for exact amounts of these supplements seem to change often, but generally speaking 600 mg of calcium (either carbonate or citrate) and 800 units of Vitamin D per day seems prudent. Certain women may benefit from higher intake of Vitamin D.  If you are among these women, your doctor will have told you during your visit.     PAP SMEARS:  Pap smears, to check for cervical cancer or precancers,  have traditionally been done yearly, although recent scientific advances have shown that most women can have pap smears less often.  However, every woman still should have a physical exam from her gynecologist or primary care physician every year. It will include a breast check, inspection of the vulva and vagina to check for abnormal growths or skin changes, a visual exam of the cervix, and then an exam to evaluate the size and shape of the uterus and ovaries.  And after 23 years of age, a rectal exam is indicated to check for rectal cancers. We will also provide age appropriate advice regarding health maintenance, like when you should have certain vaccines, screening for sexually transmitted diseases, bone density testing, colonoscopy, mammograms, etc.    MAMMOGRAMS:  All women over 23 years old should have a yearly mammogram. Many facilities now offer a "3D" mammogram, which may cost  around $50 extra out of pocket. If possible,  we recommend you accept the option to have the 3D mammogram performed.  It both reduces the number of women who will be called back for extra views which then turn out to be normal, and it is better than the routine mammogram at detecting truly abnormal areas.     COLONOSCOPY:  Colonoscopy to screen for colon cancer is recommended for all women at age 70.  We know, you hate the idea of the prep.  We agree, BUT, having colon cancer and not knowing it is worse!!  Colon cancer so often starts as a polyp that can be seen and removed at colonscopy, which can quite literally save your life!  And if your first colonoscopy is normal and you have no family history of colon cancer, most women don't have to have it again for 10 years.  Once every ten years, you can do something that may end up saving your life, right?  We will be happy to help you get it scheduled when you are ready.  Be sure to check your insurance coverage so you understand how much it will cost.  It may be covered as a preventative service at no cost, but you should check your particular policy.    Remain well hydrated, follow heart healthy diet. Urgent referral to Cardiology placed, re:  elevated heart rate and new onset of chest tightness. Since you have had recent onset of chest tightness with exertion- do not exercise or over-exert yourself until you are evaluated by Cardiology. If you notice any Red Flag symptoms (ie Severe chest pain/shortness of breath, nausea with with chest pain) seek immediate medical assistance. Continue to social distance and wear a mask when in public. Follow-up with your therapist. Follow-up in 3 months. GREAT TO SEE YOU!

## 2018-11-12 ENCOUNTER — Other Ambulatory Visit: Payer: Self-pay | Admitting: Adult Health

## 2018-11-14 ENCOUNTER — Encounter

## 2018-11-25 NOTE — Progress Notes (Signed)
Cardiology Office Note:    Date:  11/26/2018   ID:  Kimberly DeedsBritney K Veselka, DOB 1995-10-29, MRN [id number]  PCP:  Julaine Fusianford, Katy D, NP  Cardiologist:  No primary care provider on file.  Electrophysiologist:  None   Referring MD: Julaine Fusianford, Katy D, NP   Chief complaint: chest pain  History of Present Illness:    Kimberly Solomon is a 23 y.o. female with a hx of ADHD, anxiety, asthma, depression who is referred by William HamburgerKaty Danford NP for evaluation of tachycardia and chest pain.  She was seen by her PCP and noted to be tachycardic to heart rate 118.  She takes amphetamine ER for her ADHD.  Thyroid studies were normal.  She reports she has been having chest tightness with exertion.  She reported that it began this year.  Occurs with running, states that 2-4 minutes into jogging will have chest pain.  Also with shortness of breath and lightheadedness. One episode of syncope when she was in 8th grade, was in chorus and standing and felt like she wa going to black out before she passed out.  She has been checking her HR with a pulse-ox, has been up to 150s with just walking.  States that she has episodes where feels like heart is racing, occurs about once every 2 days.  Will last for few minutes but can be up to 20 minutes. Feels lightheadedess and and has chest pain during episodes.       No history of heart disease in immediate family.  Past Medical History:  Diagnosis Date  . ADHD (attention deficit hyperactivity disorder)   . Allergy   . Anxiety   . Asthma   . Depression   . Eating disorder   . Mental disorder     Past Surgical History:  Procedure Laterality Date  . ADENOIDECTOMY  1999  . KNEE SURGERY Left   . TYMPANOTOMY  1999  . WISDOM TOOTH EXTRACTION  2015    Current Medications: No outpatient medications have been marked as taking for the 11/26/18 encounter (Office Visit) with Little IshikawaSchumann, Eagan Shifflett L, MD.     Allergies:   Prozac [fluoxetine hcl], Adhesive [tape], Codeine, Latex, Mobic  [meloxicam], Neosporin [neomycin-bacitracin zn-polymyx], Amoxicillin, and Penicillins   Social History   Socioeconomic History  . Marital status: Single    Spouse name: Not on file  . Number of children: 0  . Years of education: Not on file  . Highest education level: Not on file  Occupational History  . Occupation: Consulting civil engineertudent  Social Needs  . Financial resource strain: Not on file  . Food insecurity    Worry: Not on file    Inability: Not on file  . Transportation needs    Medical: Not on file    Non-medical: Not on file  Tobacco Use  . Smoking status: Never Smoker  . Smokeless tobacco: Never Used  Substance and Sexual Activity  . Alcohol use: No    Alcohol/week: 0.0 standard drinks    Frequency: Never    Comment: rare- last drank summer of 2016- usually 1 drink per episode  . Drug use: No  . Sexual activity: Yes    Birth control/protection: Pill, Condom  Lifestyle  . Physical activity    Days per week: Not on file    Minutes per session: Not on file  . Stress: Not on file  Relationships  . Social Musicianconnections    Talks on phone: Not on file    Gets together: Not  on file    Attends religious service: Not on file    Active member of club or organization: Not on file    Attends meetings of clubs or organizations: Not on file    Relationship status: Not on file  Other Topics Concern  . Not on file  Social History Narrative   Born in [place] but raised in [place] by both parents. Pt has one older sister. Pt goes to [employer] and lives alone. She is studying education. Single, never married, no kids.      Family History: The patient's family history includes ADD / ADHD in her father; Anxiety disorder in her maternal grandfather, maternal grandmother, and mother; Depression in her maternal grandfather and mother; Diabetes in her paternal grandfather; Drug abuse in her maternal grandfather; Heart disease in her maternal grandfather and paternal grandfather;  Hyperlipidemia in her maternal grandfather, paternal grandfather, and paternal grandmother; Hypertension in her maternal grandfather and paternal grandfather; Migraines in her mother; Stroke in her maternal grandfather. There is no history of Suicidality.  ROS:   Please see the history of present illness.     All other systems reviewed and are negative.  EKGs/Labs/Other Studies Reviewed:    The following studies were reviewed today:   EKG:  EKG is  ordered today.  The ekg ordered today demonstrates sinus tachycardia with rate 113, QTc 455, TWI in III, 23mm ST dep lead II, aVF  Recent Labs: 04/23/2018: ALT 14; BUN 13; Creatinine, Ser 1.02; Hemoglobin 13.9; Platelets 228; Potassium 4.5; Sodium 140; TSH 1.940  Recent Lipid Panel No results found for: CHOL, TRIG, HDL, CHOLHDL, VLDL, LDLCALC, LDLDIRECT  Physical Exam:    VS:  BP 116/70   Pulse (!) 113   Temp 99.1 F (37.3 C) (Temporal)   Ht 5\' 7"  (1.702 m)   Wt 144 lb 6.4 oz (65.5 kg)   BMI 22.62 kg/m     Wt Readings from Last 3 Encounters:  11/26/18 144 lb 6.4 oz (65.5 kg)  11/11/18 140 lb 14.4 oz (63.9 kg)  05/06/18 141 lb 12.8 oz (64.3 kg)     GEN: no acute distress HEENT: Normal NECK: No JVD LYMPHATICS: No lymphadenopathy CARDIAC:tachycardi, regular, no murmurs, rubs, gallops RESPIRATORY:  Clear to auscultation without rales, wheezing or rhonchi  ABDOMEN: Soft, non-tender, non-distended MUSCULOSKELETAL:  No edema; No deformity  SKIN: Warm and dry NEUROLOGIC:  Alert and oriented x 3 PSYCHIATRIC:  Normal affect   ASSESSMENT:    1. Precordial pain   2. Palpitations   3. Tachycardia    PLAN:    In order of problems listed above:  Chest pain: reports exertional chest pain.  Given age, warrants evaluation for congenital heart disease, including coronary anomalies - TTE to evaluate for structural heart disease - Coronary CTA to evaluate for coronary artery anomalies  Palpitations: reports episodes where feels like  heart is racing, occurs ~every other day, can last up to 20 minutes.  Amphetamine ER that she takes for ADHD is a potential cause, but she is not interested in stopping or changing medications at this time - Zio patch x 1 week  Resting tachycardia: likely related to amphetamine ER she takes for ADHD   Medication Adjustments/Labs and Tests Ordered: Current medicines are reviewed at length with the patient today.  Concerns regarding medicines are outlined above.  Orders Placed This Encounter  Procedures  . CT CORONARY MORPH W/CTA COR W/SCORE W/CA W/CM &/OR WO/CM  . CT CORONARY FRACTIONAL FLOW RESERVE DATA  PREP  . CT CORONARY FRACTIONAL FLOW RESERVE FLUID ANALYSIS  . Basic metabolic panel  . LONG TERM MONITOR (3-14 DAYS)  . EKG 12-Lead  . ECHOCARDIOGRAM COMPLETE   Meds ordered this encounter  Medications  . diltiazem (CARDIZEM) 60 MG tablet    Sig: Take 1 tablet (60 mg total) by mouth once for 1 dose. Before CT    Dispense:  1 tablet    Refill:  0    Patient Instructions  Medication Instructions:  Take the Diltiazem dose 2 hours before the CT If you need a refill on your cardiac medications before your next appointment, please call your pharmacy.   Lab work: BMET 1 week before CT- when scheduled. If you have labs (blood work) drawn today and your tests are completely normal, you will receive your results only by: Marland Kitchen. MyChart Message (if you have MyChart) OR . A paper copy in the mail If you have any lab test that is abnormal or we need to change your treatment, we will call you to review the results.  Testing/Procedures: Your physician has requested that you have cardiac CT. Cardiac computed tomography (CT) is a painless test that uses an x-ray machine to take clear, detailed pictures of your heart. For further information please visit [email or web address] Please follow instruction sheet as given.  Echocardiogram - Your physician has requested that you have an echocardiogram.  Echocardiography is a painless test that uses sound waves to create images of your heart. It provides your doctor with information about the size and shape of your heart and how well your heart's chambers and valves are working. This procedure takes approximately one hour. There are no restrictions for this procedure. This will be performed at our [facility] location - [address], [address].  Your physician has recommended that you wear a 7 DAY ZIO-PATCH monitor. The Zio patch cardiac monitor continuously records heart rhythm data for up to 14 days, this is for patients being evaluated for multiple types heart rhythms. For the first 24 hours post application, please avoid getting the Zio monitor wet in the shower or by excessive sweating during exercise. After that, feel free to carry on with regular activities. Keep soaps and lotions away from the ZIO XT Patch.  This will be placed at our [facility] location - [address], [address].          Follow-Up: At [facility], you and your health needs are our priority.  As part of our continuing mission to provide you with exceptional heart care, we have created designated Provider Care Teams.  These Care Teams include your primary Cardiologist (physician) and Advanced Practice Providers (APPs -  Physician Assistants and Nurse Practitioners) who all work together to provide you with the care you need, when you need it. . Follow up with Dr.Oliviya Gilkison in 2 months after tests are completed.  Any Other Special Instructions Will Be Listed Below (If Applicable). Your cardiac CT will be scheduled at one of the below locations:   [facility] [address] [place], [place] [address] [phone]  OR  [facility] [address] [place], [place] [address] [phone]  Please [facility] the [facility] main entrance of [facility] 30-45 minutes prior to test start time. Proceed  to the [facility] Rad[facility] Department (first floor) to check-in and test prep.  Please follow these instructions carefully (unless otherwise directed):  Hold all erectile  dysfunction medications at least 48 hours prior to test.  On the Night Before the Test: . Be sure to Drink plenty of water. . Do not consume any caffeinated/decaffeinated beverages or chocolate 12 hours prior to your test. . Do not take any antihistamines 12 hours prior to your test. . If you take Metformin do not take 24 hours prior to test. . If the patient has contrast allergy: ? Patient will need a prescription for Prednisone and very clear instructions (as follows): 1. Prednisone 50 mg - take 13 hours prior to test 2. Take another Prednisone 50 mg 7 hours prior to test 3. Take another Prednisone 50 mg 1 hour prior to test 4. Take Benadryl 50 mg 1 hour prior to test . Patient must complete all four doses of above prophylactic medications. . Patient will need a ride after test due to Benadryl.  On the Day of the Test: . Drink plenty of water. Do not drink any water within one hour of the test. . Do not eat any food 4 hours prior to the test. . You may take your regular medications prior to the test.  . Take Diltiazem two hours prior to test. . HOLD Furosemide/Hydrochlorothiazide morning of the test. . FEMALES- please wear underwire-free bra if available       After the Test: . Drink plenty of water. . After receiving IV contrast, you may experience a mild flushed feeling. This is normal. . On occasion, you may experience a mild rash up to 24 hours after the test. This is not dangerous. If this occurs, you can take Benadryl 25 mg and increase your fluid intake. . If you experience trouble breathing, this can be serious. If it is severe call 911 IMMEDIATELY. If it is mild, please call our office. . If you take any of these medications: Glipizide/Metformin, Avandament, Glucavance, please do not take 48 hours after  completing test.    Please contact the cardiac imaging nurse navigator should you have any questions/concerns Rockwell Alexandria, RN Navigator Cardiac Imaging [facility] [phone] Office  [phone] Cell       Signed, Little Ishikawa, MD  11/26/2018 11:47 AM    [facility]

## 2018-11-26 ENCOUNTER — Other Ambulatory Visit: Payer: Self-pay

## 2018-11-26 ENCOUNTER — Encounter: Payer: Self-pay | Admitting: Adult Health

## 2018-11-26 ENCOUNTER — Telehealth: Payer: Self-pay | Admitting: *Deleted

## 2018-11-26 ENCOUNTER — Ambulatory Visit (INDEPENDENT_AMBULATORY_CARE_PROVIDER_SITE_OTHER): Payer: 59 | Admitting: Cardiology

## 2018-11-26 VITALS — BP 116/70 | HR 113 | Temp 99.1°F | Ht 67.0 in | Wt 144.4 lb

## 2018-11-26 DIAGNOSIS — R002 Palpitations: Secondary | ICD-10-CM

## 2018-11-26 DIAGNOSIS — R000 Tachycardia, unspecified: Secondary | ICD-10-CM | POA: Diagnosis not present

## 2018-11-26 DIAGNOSIS — R072 Precordial pain: Secondary | ICD-10-CM | POA: Diagnosis not present

## 2018-11-26 MED ORDER — DILTIAZEM HCL 60 MG PO TABS: 60.0000 mg | ORAL_TABLET | Freq: Once | ORAL | 0 refills | Status: DC

## 2018-11-26 NOTE — Patient Instructions (Signed)
Medication Instructions:  Take the Diltiazem dose 2 hours before the CT If you need a refill on your cardiac medications before your next appointment, please call your pharmacy.   Lab work: BMET 1 week before CT- when scheduled. If you have labs (blood work) drawn today and your tests are completely normal, you will receive your results only by: Marland Kitchen. MyChart Message (if you have MyChart) OR . A paper copy in the mail If you have any lab test that is abnormal or we need to change your treatment, we will call you to review the results.  Testing/Procedures: Your physician has requested that you have cardiac CT. Cardiac computed tomography (CT) is a painless test that uses an x-ray machine to take clear, detailed pictures of your heart. For further information please visit [email or web address] Please follow instruction sheet as given.  Echocardiogram - Your physician has requested that you have an echocardiogram. Echocardiography is a painless test that uses sound waves to create images of your heart. It provides your doctor with information about the size and shape of your heart and how well your heart's chambers and valves are working. This procedure takes approximately one hour. There are no restrictions for this procedure. This will be performed at our [facility] location - [address], [address].  Your physician has recommended that you wear a 7 DAY ZIO-PATCH monitor. The Zio patch cardiac monitor continuously records heart rhythm data for up to 14 days, this is for patients being evaluated for multiple types heart rhythms. For the first 24 hours post application, please avoid getting the Zio monitor wet in the shower or by excessive sweating during exercise. After that, feel free to carry on with regular activities. Keep soaps and lotions away from the ZIO XT Patch.  This will be placed at our [facility] location - [address], [address].          Follow-Up: At [facility], you and  your health needs are our priority.  As part of our continuing mission to provide you with exceptional heart care, we have created designated Provider Care Teams.  These Care Teams include your primary Cardiologist (physician) and Advanced Practice Providers (APPs -  Physician Assistants and Nurse Practitioners) who all work together to provide you with the care you need, when you need it. . Follow up with Dr.Schumann in 2 months after tests are completed.  Any Other Special Instructions Will Be Listed Below (If Applicable). Your cardiac CT will be scheduled at one of the below locations:   [facility] [address] [place], [place] [phone]  OR  [facility] [address] [place], [place] [phone]  Please arrive at the [facility] main entrance of [facility] 30-45 minutes prior to test start time. Proceed to the [facility] Rad[facility] Department (first floor) to check-in and test prep.  Please follow these instructions carefully (unless otherwise directed):  Hold all erectile dysfunction medications at least 48 hours prior to test.  On the Night Before the Test: . Be sure to Drink plenty of water. . Do not consume any caffeinated/decaffeinated beverages or chocolate 12 hours prior to your test. . Do not take any antihistamines 12 hours prior to your test. . If you take Metformin do not take 24 hours prior to test. . If the patient has contrast allergy: ? Patient will need a prescription for Prednisone and very clear instructions (  as follows): 1. Prednisone 50 mg - take 13 hours prior to test 2. Take another Prednisone 50 mg 7 hours prior to test 3. Take another Prednisone 50 mg 1 hour prior to test 4. Take Benadryl 50 mg 1 hour prior to test . Patient must complete all four doses of above prophylactic medications. . Patient will need a ride after test due to Benadryl.  On the Day of  the Test: . Drink plenty of water. Do not drink any water within one hour of the test. . Do not eat any food 4 hours prior to the test. . You may take your regular medications prior to the test.  . Take Diltiazem two hours prior to test. . HOLD Furosemide/Hydrochlorothiazide morning of the test. . FEMALES- please wear underwire-free bra if available       After the Test: . Drink plenty of water. . After receiving IV contrast, you may experience a mild flushed feeling. This is normal. . On occasion, you may experience a mild rash up to 24 hours after the test. This is not dangerous. If this occurs, you can take Benadryl 25 mg and increase your fluid intake. . If you experience trouble breathing, this can be serious. If it is severe call 911 IMMEDIATELY. If it is mild, please call our office. . If you take any of these medications: Glipizide/Metformin, Avandament, Glucavance, please do not take 48 hours after completing test.    Please contact the cardiac imaging nurse navigator should you have any questions/concerns Marchia Bond, RN Navigator Cardiac [facility] [phone] Office  [phone] Cell

## 2018-11-26 NOTE — Telephone Encounter (Signed)
7 day ZIO XT long term holter monitor to be mailed to patients home.  Instructions reviewed briefly as they are included in the monitor kit. 

## 2018-12-02 ENCOUNTER — Ambulatory Visit (INDEPENDENT_AMBULATORY_CARE_PROVIDER_SITE_OTHER): Payer: 59

## 2018-12-02 DIAGNOSIS — R000 Tachycardia, unspecified: Secondary | ICD-10-CM

## 2018-12-02 DIAGNOSIS — R072 Precordial pain: Secondary | ICD-10-CM

## 2018-12-02 DIAGNOSIS — R42 Dizziness and giddiness: Secondary | ICD-10-CM

## 2018-12-04 ENCOUNTER — Other Ambulatory Visit: Payer: Self-pay

## 2018-12-17 ENCOUNTER — Other Ambulatory Visit: Payer: Self-pay

## 2018-12-17 ENCOUNTER — Ambulatory Visit (HOSPITAL_COMMUNITY): Payer: 59 | Attending: Cardiovascular Disease

## 2018-12-17 DIAGNOSIS — R072 Precordial pain: Secondary | ICD-10-CM | POA: Insufficient documentation

## 2018-12-18 ENCOUNTER — Other Ambulatory Visit: Payer: Self-pay

## 2018-12-23 ENCOUNTER — Other Ambulatory Visit: Payer: Self-pay | Admitting: Cardiology

## 2018-12-23 ENCOUNTER — Other Ambulatory Visit: Payer: Self-pay

## 2018-12-23 DIAGNOSIS — R072 Precordial pain: Secondary | ICD-10-CM

## 2018-12-23 DIAGNOSIS — R000 Tachycardia, unspecified: Secondary | ICD-10-CM

## 2018-12-23 DIAGNOSIS — R42 Dizziness and giddiness: Secondary | ICD-10-CM

## 2018-12-23 LAB — BASIC METABOLIC PANEL
BUN/Creatinine Ratio: 11 (ref 9–23)
BUN: 11 mg/dL (ref 6–20)
CO2: 23 mmol/L (ref 20–29)
Calcium: 9.7 mg/dL (ref 8.7–10.2)
Chloride: 105 mmol/L (ref 96–106)
Creatinine, Ser: 0.97 mg/dL (ref 0.57–1.00)
GFR calc Af Amer: 95 mL/min/{1.73_m2} (ref >59)
GFR calc non Af Amer: 83 mL/min/{1.73_m2} (ref >59)
Glucose: 78 mg/dL (ref 65–99)
Potassium: 5 mmol/L (ref 3.5–5.2)
Sodium: 139 mmol/L (ref 134–144)

## 2018-12-26 ENCOUNTER — Other Ambulatory Visit: Payer: Self-pay

## 2018-12-26 ENCOUNTER — Telehealth (HOSPITAL_COMMUNITY): Payer: Self-pay | Admitting: Emergency Medicine

## 2018-12-26 MED ORDER — MONTELUKAST SODIUM 10 MG PO TABS: ORAL_TABLET | ORAL | 0 refills | Status: DC

## 2018-12-26 NOTE — Telephone Encounter (Signed)
Left message on voicemail with name and callback number Brenn Deziel RN Navigator Cardiac Imaging [facility] [phone] Office [phone] Cell  

## 2018-12-29 ENCOUNTER — Ambulatory Visit (HOSPITAL_COMMUNITY)
Admission: RE | Admit: 2018-12-29 | Discharge: 2018-12-29 | Disposition: A | Discharge status: Home / Self Care | Payer: 59 | Source: Ambulatory Visit | Attending: Cardiology | Admitting: Cardiology

## 2018-12-29 ENCOUNTER — Other Ambulatory Visit: Payer: Self-pay

## 2018-12-29 DIAGNOSIS — R072 Precordial pain: Secondary | ICD-10-CM | POA: Insufficient documentation

## 2018-12-29 MED ORDER — NITROGLYCERIN 0.4 MG SL SUBL
SUBLINGUAL_TABLET | SUBLINGUAL | Status: AC
  Administered 2018-12-29: 0.8 mg
  Filled 2018-12-29: qty 2

## 2018-12-29 MED ORDER — IOHEXOL 350 MG/ML SOLN
80.0000 mL | Freq: Once | INTRAVENOUS | Status: AC | PRN
  Administered 2018-12-29: 80 mL via INTRAVENOUS

## 2018-12-29 MED ORDER — NITROGLYCERIN 0.4 MG SL SUBL
0.8000 mg | SUBLINGUAL_TABLET | SUBLINGUAL | Status: DC | PRN
  Filled 2018-12-29: qty 25

## 2019-02-02 NOTE — Progress Notes (Signed)
Cardiology Office Note:    Date:  02/04/2019   ID:  Kimberly Solomon, DOB [birth year], MRN [id number]  PCP:  Julaine Fusi, NP  Cardiologist:  No primary care provider on file.  Electrophysiologist:  None   Referring MD: Julaine Fusi, NP   Chief Complaint  Patient presents with  . Chest Pain    History of Present Illness:    Kimberly Solomon is a 23 y.o. female with a hx of ADHD, anxiety, asthma, depression who presents for follow-up visit of tachycardia and chest pain.  She was initially seen on 11/26/2018.  She was seen by her PCP and noted to be tachycardic to heart rate 118.  She takes amphetamine ER for her ADHD.  Thyroid studies were normal.  She reports she has been having chest tightness with exertion.  She reported that it began this year.  Occurs with running, states that 2-4 minutes into jogging will have chest pain.  Also with shortness of breath and lightheadedness. One episode of syncope when she was in 8th grade, was in chorus and standing and felt like she wa going to black out before she passed out.  She has been checking her HR with a pulse-ox, has been up to 150s with just walking.  States that she has episodes where feels like heart is racing, occurs about once every 2 days.  Will last for few minutes but can be up to 20 minutes. Feels lightheadedess and and has chest pain during episodes.  No history of heart disease in immediate family.  Given her exertional chest pain, coronary CTA was ordered to evaluate for coronary artery anomalies and TTE was ordered to evaluate for structural heart disease.  Both studies showed no abnormalities.  Given her palpitations, Zio patch was ordered for 1 week, which showed one 7 beat run of NSVT versus SVT with aberrancy.  Her triggered events corresponded to sinus rhythm.  She reports that she contineus to have exertional chest pain, unchanged from prior visit.  She states that she continues have palpitations at least once per day.  She  frequently feels like she is going to pass out but denies any syncopal episodes.   Past Medical History:  Diagnosis Date  . ADHD (attention deficit hyperactivity disorder)   . Allergy   . Anxiety   . Asthma   . Depression   . Eating disorder   . Mental disorder     Past Surgical History:  Procedure Laterality Date  . ADENOIDECTOMY  1999  . KNEE SURGERY Left   . TYMPANOTOMY  1999  . WISDOM TOOTH EXTRACTION  2015    Current Medications: Current Meds  Medication Sig  . loratadine (CLARITIN) 10 MG tablet Take 10 mg by mouth daily.     Allergies:   Prozac [fluoxetine hcl], Adhesive [tape], Codeine, Latex, Mobic [meloxicam], Neosporin [neomycin-bacitracin zn-polymyx], Amoxicillin, and Penicillins   Social History   Socioeconomic History  . Marital status: Single    Spouse name: Not on file  . Number of children: 0  . Years of education: Not on file  . Highest education level: Not on file  Occupational History  . Occupation: Consulting civil engineer  Social Needs  . Financial resource strain: Not on file  . Food insecurity    Worry: Not on file    Inability: Not on file  . Transportation needs    Medical: Not on file    Non-medical: Not on file  Tobacco Use  . Smoking status:  Never Smoker  . Smokeless tobacco: Never Used  Substance and Sexual Activity  . Alcohol use: No    Alcohol/week: 0.0 standard drinks    Frequency: Never    Comment: rare- last drank summer of 2016- usually 1 drink per episode  . Drug use: No  . Sexual activity: Yes    Birth control/protection: Pill, Condom  Lifestyle  . Physical activity    Days per week: Not on file    Minutes per session: Not on file  . Stress: Not on file  Relationships  . Social Musicianconnections    Talks on phone: Not on file    Gets together: Not on file    Attends religious service: Not on file    Active member of club or organization: Not on file    Attends meetings of clubs or organizations: Not on file    Relationship status:  Not on file  Other Topics Concern  . Not on file  Social History Narrative   [place] by both parents. Pt has one older sister. Pt goes to [employer] and lives alone. She is studying education. Single, never married, no kids.      Family History: The patient's family history includes ADD / ADHD in her father; Anxiety disorder in her maternal grandfather, maternal grandmother, and mother; Depression in her maternal grandfather and mother; Diabetes in her paternal grandfather; Drug abuse in her maternal grandfather; Heart disease in her maternal grandfather and paternal grandfather; Hyperlipidemia in her maternal grandfather, paternal grandfather, and paternal grandmother; Hypertension in her maternal grandfather and paternal grandfather; Migraines in her mother; Stroke in her maternal grandfather. There is no history of Suicidality.  ROS:   Please see the history of present illness.    All other systems reviewed and are negative.  EKGs/Labs/Other Studies Reviewed:    The following studies were reviewed today:   EKG:  EKG is  ordered today.  The ekg ordered today demonstrates sinus tachycardia, rate 110, no ST/T abnormalities  TTE 12/17/18:  1. Left ventricular ejection fraction, by visual estimation, is 55 to 60%. The left ventricle has normal function. Normal left ventricular size. There is no left ventricular hypertrophy.  2. Global right ventricle has normal systolic function.The right ventricular size is normal. No increase in right ventricular wall thickness.  3. Left atrial size was normal.  4. Right atrial size was normal.  5. Trivial pericardial effusion is present.  6. The mitral valve is normal in structure. Trace mitral valve regurgitation. No evidence of mitral stenosis.  7. The tricuspid valve is normal in structure. Tricuspid valve regurgitation is mild.  8. The aortic valve is tricuspid Aortic valve regurgitation was not visualized by  color flow Doppler. Structurally normal aortic valve, with no evidence of sclerosis or stenosis.  9. The pulmonic valve was normal in structure. Pulmonic valve regurgitation is not visualized by color flow Doppler. 10. Normal pulmonary artery systolic pressure. 11. The inferior vena cava is normal in size with greater than 50% respiratory variability, suggesting right atrial pressure of 3 mmHg.  Coronary CTA 12/29/18: 1. Coronary calcium score of 0. This was 0 percentile for age and sex matched control. 2. Normal coronary origin with right dominance. No coronary anomalies 3. No evidence of CAD.  Cardiac monitor 12/24/18: 7 days of data recorded on Zio monitor. Patient had a min HR of 46 bpm, max HR of 165 bpm, and avg HR of 88 bpm. Predominant underlying rhythm  was Sinus Rhythm. No SVT, atrial fibrillation, high degree block, or pauses noted. There was a 7 beat run of NSVT (vs SVT with aberrancy). Isolated atrial and ventricular ectopy was rare (<1%). There were 17 triggered events, corresponding to sinus rhythm and occasional ventricular ectopy.  No significant arrhythmias detected.   Recent Labs: 04/23/2018: ALT 14; Hemoglobin 13.9; Platelets 228; TSH 1.940 12/23/2018: BUN 11; Creatinine, Ser 0.97; Potassium 5.0; Sodium 139  Recent Lipid Panel No results found for: CHOL, TRIG, HDL, CHOLHDL, VLDL, LDLCALC, LDLDIRECT  Physical Exam:    VS:  BP 129/87   Pulse (!) 110   Ht  (1.702 m)   Wt 146 lb (66.2 kg)   BMI 22.87 kg/m     Wt Readings from Last 3 Encounters:  02/04/19 146 lb (66.2 kg)  11/26/18 144 lb 6.4 oz (65.5 kg)  11/11/18 140 lb 14.4 oz (63.9 kg)     GEN: Well nourished, well developed in no acute distress HEENT: Normal NECK: No JVD; No carotid bruits LYMPHATICS: No lymphadenopathy CARDIAC: RRR, no murmurs, rubs, gallops RESPIRATORY:  Clear to auscultation without rales, wheezing or rhonchi  ABDOMEN: Soft, non-tender, non-distended MUSCULOSKELETAL:  No edema; No  deformity  SKIN: Warm and dry NEUROLOGIC:  Alert and oriented x 3 PSYCHIATRIC:  Normal affect   ASSESSMENT:    1. NSVT (nonsustained ventricular tachycardia) (HCC)   2. Chest pain of uncertain etiology   3. Palpitations   4. Tachycardia    PLAN:     Chest pain: reports exertional chest pain.    TTE showed no evidence of structural heart disease and coronary CTA showed normal coronary origins.  Suspect noncardiac etiology  Palpitations: reports episodes where feels like heart is racing, occurs ~every other day, can last up to 20 minutes.  Amphetamine ER that she takes for ADHD is a potential cause, but she is not interested in stopping or changing.  Triggered events on monitor corresponded to sinus rhythm.  Resting tachycardia: likely related to amphetamine ER she takes for ADHD.  Cardiac monitor shows that heart rate does not remain elevated during the night, arguing against inappropriate sinus tachycardia  NSVT: one 7 beat run of NSVT versus SVT with aberrancy on cardiac monitor.  No evidence of structural heart disease on TTE.  Discussed that definitive test to rule out any structural heart disease, particularly to evaluate the right ventricle, would be cardiac MRI.  She reports she has significant claustrophobia and has required significant sedation for MRIs in the past, so does not think she could cooperate with breath holds to get a cardiac MRI.  Will check exercise treadmill test to see if exercise elicits arrhythmia.  RTC in 1 year   Medication Adjustments/Labs and Tests Ordered: Current medicines are reviewed at length with the patient today.  Concerns regarding medicines are outlined above.  Orders Placed This Encounter  Procedures  . POET  . EKG 12-Lead   No orders of the defined types were placed in this encounter.   Patient Instructions  Medication Instructions:  The current medical regimen is effective;  continue present plan and medications as directed. Please  refer to the Current Medication list given to you today. If you need a refill on your cardiac medications before your next appointment, please call your pharmacy.  Testing/Procedures: Your physician has requested that you have an exercise tolerance test. For further information please visit [email or web address] Please also follow instruction sheet, as given.scheduled for 12-10  Special Instructions: You will need COVID  testing 3 days before. This will be done at our [facility] site [address]. Scheduled for 12-7 @3pm   Follow-Up: IN 12 months Please call our office 2 months in advance, sept 2021 to schedule this November 2021 appointment. In Person Oswaldo Milian, MD.    At [facility], you and your health needs are our priority.  As part of our continuing mission to provide you with exceptional heart care, we have created designated Provider Care Teams.  These Care Teams include your primary Cardiologist (physician) and Advanced Practice Providers (APPs -  Physician Assistants and Nurse Practitioners) who all work together to provide you with the care you need, when you need it.  Thank you for choosing [facility] at [facility]!!          Signed, Donato Heinz, MD  02/04/2019 11:40 AM    [place]

## 2019-02-04 ENCOUNTER — Encounter: Payer: Self-pay | Admitting: Cardiology

## 2019-02-04 ENCOUNTER — Other Ambulatory Visit: Payer: Self-pay

## 2019-02-04 ENCOUNTER — Ambulatory Visit (INDEPENDENT_AMBULATORY_CARE_PROVIDER_SITE_OTHER): Payer: 59 | Admitting: Cardiology

## 2019-02-04 VITALS — BP 129/87 | HR 110 | Ht 67.0 in | Wt 146.0 lb

## 2019-02-04 DIAGNOSIS — R000 Tachycardia, unspecified: Secondary | ICD-10-CM

## 2019-02-04 DIAGNOSIS — R079 Chest pain, unspecified: Secondary | ICD-10-CM | POA: Diagnosis not present

## 2019-02-04 DIAGNOSIS — I472 Ventricular tachycardia: Secondary | ICD-10-CM

## 2019-02-04 DIAGNOSIS — R002 Palpitations: Secondary | ICD-10-CM

## 2019-02-04 DIAGNOSIS — I4729 Other ventricular tachycardia: Secondary | ICD-10-CM

## 2019-02-04 NOTE — Patient Instructions (Addendum)
Medication Instructions:  The current medical regimen is effective;  continue present plan and medications as directed. Please refer to the Current Medication list given to you today. If you need a refill on your cardiac medications before your next appointment, please call your pharmacy.  Testing/Procedures: Your physician has requested that you have an exercise tolerance test. For further information please visit [email or web address]. Please also follow instruction sheet, as given.scheduled for 12-10  Special Instructions: You will need COVID testing 3 days before. This will be done at our [facility] site [address]. Scheduled for 12-7 @3pm   Follow-Up: IN 12 months Please call our office 2 months in advance, sept 2021 to schedule this November 2021 appointment. In Person Oswaldo Milian, MD.    At [facility], you and your health needs are our priority.  As part of our continuing mission to provide you with exceptional heart care, we have created designated Provider Care Teams.  These Care Teams include your primary Cardiologist (physician) and Advanced Practice Providers (APPs -  Physician Assistants and Nurse Practitioners) who all work together to provide you with the care you need, when you need it.  Thank you for choosing [facility] at [facility]!!

## 2019-02-23 ENCOUNTER — Other Ambulatory Visit (HOSPITAL_COMMUNITY)
Admission: RE | Admit: 2019-02-23 | Discharge: 2019-02-23 | Disposition: A | Discharge status: Home / Self Care | Payer: 59 | Source: Ambulatory Visit | Attending: Cardiology | Admitting: Cardiology

## 2019-02-23 DIAGNOSIS — Z20828 Contact with and (suspected) exposure to other viral communicable diseases: Secondary | ICD-10-CM | POA: Insufficient documentation

## 2019-02-23 DIAGNOSIS — Z01812 Encounter for preprocedural laboratory examination: Secondary | ICD-10-CM | POA: Insufficient documentation

## 2019-02-24 ENCOUNTER — Telehealth (HOSPITAL_COMMUNITY): Payer: Self-pay

## 2019-02-24 LAB — NOVEL CORONAVIRUS, NAA (HOSP ORDER, SEND-OUT TO REF LAB; TAT 18-24 HRS): SARS-CoV-2, NAA: NOT DETECTED

## 2019-02-24 NOTE — Telephone Encounter (Signed)
Encounter complete. 

## 2019-02-25 ENCOUNTER — Encounter (HOSPITAL_COMMUNITY): Payer: 59

## 2019-02-26 ENCOUNTER — Ambulatory Visit (HOSPITAL_COMMUNITY)
Admission: RE | Admit: 2019-02-26 | Discharge: 2019-02-26 | Disposition: A | Discharge status: Home / Self Care | Payer: 59 | Source: Ambulatory Visit | Attending: Internal Medicine | Admitting: Internal Medicine

## 2019-02-26 ENCOUNTER — Other Ambulatory Visit: Payer: Self-pay

## 2019-02-26 DIAGNOSIS — I472 Ventricular tachycardia: Secondary | ICD-10-CM | POA: Diagnosis not present

## 2019-02-26 LAB — EXERCISE TOLERANCE TEST
Estimated workload: 10.6 METS
Exercise duration (min): 9 min
Exercise duration (sec): 21 s
MPHR: 197 {beats}/min
Peak HR: 190 {beats}/min
Percent HR: 96 %
RPE: 17
Rest HR: 116 {beats}/min

## 2019-03-03 ENCOUNTER — Telehealth: Payer: Self-pay | Admitting: Cardiology

## 2019-03-03 NOTE — Telephone Encounter (Signed)
Spoke with Angelia pt mother and updated her on stress test results. Verbalized understanding.

## 2019-03-03 NOTE — Telephone Encounter (Signed)
Left a message for the patient to call back.  

## 2019-03-03 NOTE — Telephone Encounter (Signed)
New Message  Pt is calling regarding the results of her stress test  Please call to discuss

## 2019-03-03 NOTE — Telephone Encounter (Signed)
Patient's mother called, please call her with results.

## 2019-04-08 ENCOUNTER — Telehealth: Payer: Self-pay | Admitting: Adult Health

## 2019-04-08 NOTE — Telephone Encounter (Signed)
Called OB/Gyn's ([facility]) [phone] per Victorino Dike (pt declined HIV screening )--  Forwarding updated information to med asst.  -glh

## 2019-04-09 NOTE — Telephone Encounter (Signed)
Noted.  T. Nelson, CMA 

## 2019-07-05 ENCOUNTER — Other Ambulatory Visit: Payer: Self-pay | Admitting: Adult Health

## 2019-07-06 ENCOUNTER — Telehealth: Payer: Self-pay

## 2019-07-06 NOTE — Telephone Encounter (Signed)
Please call pt to schedule appt.  No further refills until pt is seen.  T. Kyliee Ortego, CMA  

## 2020-02-07 NOTE — Progress Notes (Signed)
Cardiology Office Note:    Date:  02/08/2020   ID:  Kimberly Solomon, DOB 12-May-1995, MRN [id number]  PCP:  Patient, No Pcp Per  Cardiologist:  No primary care provider on file.  Electrophysiologist:  None   Referring MD: No ref. provider found   Chief Complaint  Patient presents with  . Dizziness    History of Present Illness:    Kimberly Solomon is a 24 y.o. female with a hx of ADHD, anxiety, asthma, depression who presents for follow-up visit of tachycardia and chest pain.  She was initially seen on 11/26/2018.  She was seen by her PCP and noted to be tachycardic to heart rate 118.  She takes amphetamine ER for her ADHD.  Thyroid studies were normal.  She reports she has been having chest tightness with exertion.  She reported that it began this year.  Occurs with running, states that 2-4 minutes into jogging will have chest pain.  Also with shortness of breath and lightheadedness. One episode of syncope when she was in 8th grade, was in chorus and standing and felt like she was going to black out before she passed out.  She has been checking her HR with a pulse-ox, has been up to 150s with just walking.  States that she has episodes where feels like heart is racing, occurs about once every 2 days.  Will last for few minutes but can be up to 20 minutes. Feels lightheadedess and and has chest pain during episodes.  No history of heart disease in immediate family.  Given her exertional chest pain, coronary CTA was ordered to evaluate for coronary artery anomalies and TTE was ordered to evaluate for structural heart disease.  Both studies showed no abnormalities.  Given her palpitations, Zio patch was ordered for 1 week, which showed one 7 beat run of NSVT versus SVT with aberrancy.  Her triggered events corresponded to sinus rhythm.  ETT on 02/26/2019 showed no evidence of ischemia or arrhythmia with stress.  Since last clinic visit, she reports that she has been doing okay.  States that her  chest pain has significantly improved, only occurring about once per month.  No clear triggers, can occur at rest.  She reports she continues to have lightheadedness, particularly when standing.  Denies any syncopal episodes.  Reports palpitations have improved, about twice per month and usually lasting for a few seconds.   Past Medical History:  Diagnosis Date  . ADHD (attention deficit hyperactivity disorder)   . Allergy   . Anxiety   . Asthma   . Depression   . Eating disorder   . Mental disorder     Past Surgical History:  Procedure Laterality Date  . ADENOIDECTOMY  1999  . KNEE SURGERY Left   . TYMPANOTOMY  1999  . WISDOM TOOTH EXTRACTION  2015    Current Medications: Current Meds  Medication Sig  . Acetaminophen-Caff-Pyrilamine (MIDOL COMPLETE PO) Take 2 tablets by mouth 2 (two) times daily as needed.  Marland Kitchen albuterol (PROVENTIL HFA;VENTOLIN HFA) 108 (90 BASE) MCG/ACT inhaler Inhale 2 puffs into the lungs every 6 (six) hours as needed for wheezing or shortness of breath.  . Amphetamine ER (ADZENYS XR-ODT) 18.8 MG TBED Take by mouth.  . desvenlafaxine (PRISTIQ) 50 MG 24 hr tablet Take 50 mg by mouth daily.  . diphenhydrAMINE (BENADRYL) 25 MG tablet Take 25 mg by mouth every 6 (six) hours as needed.  Marland Kitchen erythromycin with ethanol (EMGEL) 2 % gel Apply 1 application  topically daily as needed.  . ferrous sulfate 325 (65 FE) MG tablet Take 325 mg by mouth daily with breakfast.  . fluticasone furoate-vilanterol (BREO ELLIPTA) 100-25 MCG/INH AEPB TAKE 1 PUFF BY MOUTH EVERY DAY  . guanFACINE (TENEX) 2 MG tablet Take 2 mg by mouth at bedtime.  . hydrOXYzine (VISTARIL) 50 MG capsule Take 50 mg by mouth daily. Takes 1 time up to 3 times daily as needed  . ibuprofen (ADVIL,MOTRIN) 200 MG tablet Take 400 mg by mouth every 6 (six) hours as needed for mild pain or moderate pain.  Marland Kitchen. levocetirizine (XYZAL) 5 MG tablet Take 5 mg by mouth daily.  . Melatonin 5 MG CAPS Take by mouth.  . montelukast  (SINGULAIR) 10 MG tablet TAKE 1 TABLET BY MOUTH EVERYDAY AT BEDTIME  . norgestimate-ethinyl estradiol (ORTHO-CYCLEN,SPRINTEC,PREVIFEM) 0.25-35 MG-MCG tablet Take 1 tablet by mouth daily.  Illa Level. QNASL 80 MCG/ACT AERS Place 1 spray into both nostrils daily.     Allergies:   Prozac [fluoxetine hcl], Adhesive [tape], Codeine, Latex, Mobic [meloxicam], Neosporin [neomycin-bacitracin zn-polymyx], Amoxicillin, and Penicillins   Social History   Socioeconomic History  . Marital status: Single    Spouse name: Not on file  . Number of children: 0  . Years of education: Not on file  . Highest education level: Not on file  Occupational History  . Occupation: Consulting civil engineertudent  Tobacco Use  . Smoking status: Never Smoker  . Smokeless tobacco: Never Used  Vaping Use  . Vaping Use: Never used  Substance and Sexual Activity  . Alcohol use: No    Alcohol/week: 0.0 standard drinks    Comment: rare- last drank summer of 2016- usually 1 drink per episode  . Drug use: No  . Sexual activity: Yes    Birth control/protection: Pill, Condom  Other Topics Concern  . Not on file  Social History Narrative   [place] parents. Pt has one older sister. Pt goes to [employer] and lives alone. She is studying education. Single, never married, no kids.    Social Determinants of Health   Financial Resource Strain:   . Difficulty of Paying Living Expenses: Not on file  Food Insecurity:   . Worried About Programme researcher, broadcasting/film/videounning Out of Food in the Last Year: Not on file  . Ran Out of Food in the Last Year: Not on file  Transportation Needs:   . Lack of Transportation (Medical): Not on file  . Lack of Transportation (Non-Medical): Not on file  Physical Activity:   . Days of Exercise per Week: Not on file  . Minutes of Exercise per Session: Not on file  Stress:   . Feeling of Stress : Not on file  Social Connections:   . Frequency of Communication with Friends and Family: Not on file  .  Frequency of Social Gatherings with Friends and Family: Not on file  . Attends Religious Services: Not on file  . Active Member of Clubs or Organizations: Not on file  . Attends BankerClub or Organization Meetings: Not on file  . Marital Status: Not on file     Family History: The patient's family history includes ADD / ADHD in her father; Anxiety disorder in her maternal grandfather, maternal grandmother, and mother; Depression in her maternal grandfather and mother; Diabetes in her paternal grandfather; Drug abuse in her maternal grandfather; Heart disease in her maternal grandfather and paternal grandfather; Hyperlipidemia in her maternal grandfather, paternal grandfather, and paternal grandmother; Hypertension  in her maternal grandfather and paternal grandfather; Migraines in her mother; Stroke in her maternal grandfather. There is no history of Suicidality.  ROS:   Please see the history of present illness.    All other systems reviewed and are negative.  EKGs/Labs/Other Studies Reviewed:    The following studies were reviewed today:   EKG:  EKG is  ordered today.  The ekg ordered today demonstrates sinus rhythm, rate 75, PACs, no ST/T abnormalities chest pain  TTE 12/17/18:  1. Left ventricular ejection fraction, by visual estimation, is 55 to 60%. The left ventricle has normal function. Normal left ventricular size. There is no left ventricular hypertrophy.  2. Global right ventricle has normal systolic function.The right ventricular size is normal. No increase in right ventricular wall thickness.  3. Left atrial size was normal.  4. Right atrial size was normal.  5. Trivial pericardial effusion is present.  6. The mitral valve is normal in structure. Trace mitral valve regurgitation. No evidence of mitral stenosis.  7. The tricuspid valve is normal in structure. Tricuspid valve regurgitation is mild.  8. The aortic valve is tricuspid Aortic valve regurgitation was not visualized by  color flow Doppler. Structurally normal aortic valve, with no evidence of sclerosis or stenosis.  9. The pulmonic valve was normal in structure. Pulmonic valve regurgitation is not visualized by color flow Doppler. 10. Normal pulmonary artery systolic pressure. 11. The inferior vena cava is normal in size with greater than 50% respiratory variability, suggesting right atrial pressure of 3 mmHg.  Coronary CTA 12/29/18: 1. Coronary calcium score of 0. This was 0 percentile for age and sex matched control. 2. Normal coronary origin with right dominance. No coronary anomalies 3. No evidence of CAD.  Cardiac monitor 12/24/18: 7 days of data recorded on Zio monitor. Patient had a min HR of 46 bpm, max HR of 165 bpm, and avg HR of 88 bpm. Predominant underlying rhythm was Sinus Rhythm. No SVT, atrial fibrillation, high degree block, or pauses noted. There was a 7 beat run of NSVT (vs SVT with aberrancy). Isolated atrial and ventricular ectopy was rare (<1%). There were 17 triggered events, corresponding to sinus rhythm and occasional ventricular ectopy.  No significant arrhythmias detected.   Recent Labs: No results found for requested labs within last 8760 hours.  Recent Lipid Panel No results found for: CHOL, TRIG, HDL, CHOLHDL, VLDL, LDLCALC, LDLDIRECT  Physical Exam:    VS:  Ht 5\' 7"  (1.702 m)   Wt 139 lb 12.8 oz (63.4 kg)   SpO2 99%   BMI 21.90 kg/m     Wt Readings from Last 3 Encounters:  02/08/20 139 lb 12.8 oz (63.4 kg)  02/04/19 146 lb (66.2 kg)  11/26/18 144 lb 6.4 oz (65.5 kg)     GEN: Well nourished, well developed in no acute distress HEENT: Normal NECK: No JVD; No carotid bruits LYMPHATICS: No lymphadenopathy CARDIAC: RRR, no murmurs, rubs, gallops RESPIRATORY:  Clear to auscultation without rales, wheezing or rhonchi  ABDOMEN: Soft, non-tender, non-distended MUSCULOSKELETAL:  No edema; No deformity  SKIN: Warm and dry NEUROLOGIC:  Alert and oriented x  3 PSYCHIATRIC:  Normal affect   ASSESSMENT:    1. Chest pain of uncertain etiology   2. NSVT (nonsustained ventricular tachycardia) (HCC)   3. Palpitations   4. Dizziness   5. Tachycardia, unspecified    PLAN:    Chest pain: reports exertional chest pain.    TTE showed no evidence of structural heart disease  and coronary CTA showed normal coronary origins.  Suspect noncardiac etiology  Lightheadedness: No structural heart disease on echocardiogram.  Orthostatics in clinic today show significant increase in heart rate with standing, concerning for POTS.  Advised to stay well-hydrated.  Recommend compression stockings.  Discussed that the best long term management of POTS symptoms is gradual exercise conditioning. Recommend seated exercises such as bike to start, to avoid the risk of falling with lightheadedness  Palpitations: reports episodes where feels like heart is racing, was occurs ~every other day, can last up to 20 minutes.  Amphetamine ER that she takes for ADHD is a potential cause, but she is not interested in stopping or changing.  Triggered events on monitor corresponded to sinus rhythm.  She reports palpitations have improved, now occurring about twice per month and lasting for few seconds.  Resting tachycardia: likely related to amphetamine ER she takes for ADHD.  Cardiac monitor shows that heart rate does not remain elevated during the night, arguing against inappropriate sinus tachycardia  NSVT: one 7 beat run of NSVT versus SVT with aberrancy on cardiac monitor.  No evidence of structural heart disease on TTE.  Discussed that definitive test to rule out any structural heart disease, particularly to evaluate the right ventricle, would be cardiac MRI.  She reports she has significant claustrophobia and has required significant sedation for MRIs in the past, so does not think she could cooperate with breath holds to get a cardiac MRI.  ETT on 02/26/2019 showed no evidence of  ischemia or arrhythmia with stress.  RTC in 6 months   Medication Adjustments/Labs and Tests Ordered: Current medicines are reviewed at length with the patient today.  Concerns regarding medicines are outlined above.  No orders of the defined types were placed in this encounter.  No orders of the defined types were placed in this encounter.   Patient Instructions  Medication Instructions:  Your physician recommends that you continue on your current medications as directed. Please refer to the Current Medication list given to you today.  *If you need a refill on your cardiac medications before your next appointment, please call your pharmacy*  Follow-Up: At [facility], you and your health needs are our priority.  As part of our continuing mission to provide you with exceptional heart care, we have created designated Provider Care Teams.  These Care Teams include your primary Cardiologist (physician) and Advanced Practice Providers (APPs -  Physician Assistants and Nurse Practitioners) who all work together to provide you with the care you need, when you need it.  We recommend signing up for the patient portal called "MyChart".  Sign up information is provided on this After Visit Summary.  MyChart is used to connect with patients for Virtual Visits (Telemedicine).  Patients are able to view lab/test results, encounter notes, upcoming appointments, etc.  Non-urgent messages can be sent to your provider as well.   To learn more about what you can do with MyChart, go to [email or web address].    Your next appointment:   6 month(s)  The format for your next appointment:   In Person  Provider:   Epifanio Lesches, MD      Signed, Little Ishikawa, MD  02/08/2020 11:36 PM    [facility]

## 2020-02-08 ENCOUNTER — Ambulatory Visit (INDEPENDENT_AMBULATORY_CARE_PROVIDER_SITE_OTHER): Payer: 59 | Admitting: Cardiology

## 2020-02-08 ENCOUNTER — Encounter: Payer: Self-pay | Admitting: Cardiology

## 2020-02-08 ENCOUNTER — Other Ambulatory Visit: Payer: Self-pay

## 2020-02-08 VITALS — Ht 67.0 in | Wt 139.8 lb

## 2020-02-08 DIAGNOSIS — I472 Ventricular tachycardia: Secondary | ICD-10-CM

## 2020-02-08 DIAGNOSIS — R079 Chest pain, unspecified: Secondary | ICD-10-CM | POA: Diagnosis not present

## 2020-02-08 DIAGNOSIS — R42 Dizziness and giddiness: Secondary | ICD-10-CM | POA: Diagnosis not present

## 2020-02-08 DIAGNOSIS — R002 Palpitations: Secondary | ICD-10-CM

## 2020-02-08 DIAGNOSIS — I4729 Other ventricular tachycardia: Secondary | ICD-10-CM

## 2020-02-08 DIAGNOSIS — R000 Tachycardia, unspecified: Secondary | ICD-10-CM

## 2020-02-08 NOTE — Patient Instructions (Signed)

## 2020-02-16 NOTE — Addendum Note (Signed)
Addended by: Johney Frame A on: 02/16/2020 12:00 PM   Modules accepted: Orders

## 2020-03-07 ENCOUNTER — Ambulatory Visit
Admission: EM | Admit: 2020-03-07 | Discharge: 2020-03-07 | Disposition: A | Discharge status: Home / Self Care | Payer: 59 | Attending: Emergency Medicine | Admitting: Emergency Medicine

## 2020-03-07 DIAGNOSIS — J4540 Moderate persistent asthma, uncomplicated: Secondary | ICD-10-CM

## 2020-03-07 DIAGNOSIS — J069 Acute upper respiratory infection, unspecified: Secondary | ICD-10-CM

## 2020-03-07 LAB — POCT RAPID STREP A (OFFICE): Rapid Strep A Screen: NEGATIVE

## 2020-03-07 MED ORDER — BENZONATATE 200 MG PO CAPS: 200.0000 mg | ORAL_CAPSULE | Freq: Three times a day (TID) | ORAL | 0 refills | Status: AC | PRN

## 2020-03-07 MED ORDER — PREDNISONE 20 MG PO TABS: 40.0000 mg | ORAL_TABLET | Freq: Every day | ORAL | 0 refills | Status: AC

## 2020-03-07 NOTE — Discharge Instructions (Signed)
Strep test was negative, Covid/flu test pending Rest and fluids Tylenol and ibuprofen for sore throat, headaches Continue xyzal, albuterol inhaler/nebulizers as needed for shortness of breath, chest tightness May continue Mucinex to further help loosen mucus in chest May use Tessalon for cough Prednisone course provided to use for asthma if inhalers and nebulizers alone not improving shortness of breath and chest tightness  Please follow-up if any symptoms not improving or worsening

## 2020-03-07 NOTE — ED Triage Notes (Signed)
Pt c/o sore throat, productive cough with green sputum, fatigue for approx 3 days. Has been taking her Rx xyzal, Breo, elipta, Took albuterol inhaler yesterday 2/2 SOB.  Took mucinex yesterday w/o improvement to symptoms   Bilateral lungs CTA.

## 2020-03-07 NOTE — ED Provider Notes (Signed)
[facility]    CSN: [id number] Arrival date & time: 03/07/20  0818      History   Chief Complaint Chief Complaint  Patient presents with  . Sore Throat  . Cough    HPI Kimberly Solomon is a 24 y.o. female history of asthma presenting today for evaluation of URI symptoms.  Reports that she has had a cough, congestion and sore throat for the past 3 days.  Reports a lot of chest tightness and slight shortness of breath, denies significant wheezing.  Using albuterol and asthma inhalers for shortness of breath.  Has had a lot of fatigue.  Denies any known fevers.  Denies any close sick contacts.  HPI  Past Medical History:  Diagnosis Date  . ADHD (attention deficit hyperactivity disorder)   . Allergy   . Anxiety   . Asthma   . Depression   . Eating disorder   . Mental disorder     Patient Active Problem List   Diagnosis Date Noted  . Cough in adult 05/06/2018  . Tachycardia 04/23/2018  . Chest pain 04/23/2018  . Rib pain on right side 04/23/2018  . Low TSH level 04/23/2018  . Asthma 10/02/2017  . Healthcare maintenance 04/09/2017  . Knee pain, chronic 09/04/2016  . Anorexia nervosa 09/27/2015  . Social anxiety disorder 09/27/2015  . GAD (generalized anxiety disorder) 09/27/2015  . Panic disorder without agoraphobia 09/27/2015  . Attention deficit hyperactivity disorder (ADHD), combined type 09/27/2015  . 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 Transient alteration of awareness 06/23/2015  . Depression 06/23/2015  . Anxiety 06/23/2015  . Eating disorder 04/16/2011    Past Surgical History:  Procedure Laterality Date  . ADENOIDECTOMY  1999  . KNEE SURGERY Left   . TYMPANOTOMY  1999  . WISDOM TOOTH EXTRACTION  2015    OB History   No obstetric history on file.      Home Medications    Prior to Admission medications   Medication Sig Start Date End Date Taking? Authorizing Provider  albuterol (PROVENTIL  HFA;VENTOLIN HFA) 108 (90 BASE) MCG/ACT inhaler Inhale 2 puffs into the lungs every 6 (six) hours as needed for wheezing or shortness of breath.   Yes [provider]  Amphetamine ER (ADZENYS XR-ODT) 18.8 MG TBED Take by mouth.   Yes [provider]  desvenlafaxine (PRISTIQ) 50 MG 24 hr tablet Take 50 mg by mouth daily. 05/27/17  Yes [provider]  ferrous sulfate 325 (65 FE) MG tablet Take 325 mg by mouth daily with breakfast.   Yes [provider]  fluticasone furoate-vilanterol (BREO ELLIPTA) 100-25 MCG/INH AEPB TAKE 1 PUFF BY MOUTH EVERY DAY 05/03/17  Yes [provider]  guanFACINE (TENEX) 2 MG tablet Take 2 mg by mouth at bedtime.   Yes [provider]  hydrOXYzine (VISTARIL) 50 MG capsule Take 50 mg by mouth daily. Takes 1 time up to 3 times daily as needed   Yes [provider]  levocetirizine (XYZAL) 5 MG tablet Take 5 mg by mouth daily.   Yes [provider]  Melatonin 5 MG CAPS Take by mouth.   Yes [provider]  montelukast (SINGULAIR) 10 MG tablet TAKE 1 TABLET BY MOUTH EVERYDAY AT BEDTIME 07/06/19  Yes Abonza, Maritza, PA-C  norgestimate-ethinyl estradiol (ORTHO-CYCLEN,SPRINTEC,PREVIFEM) 0.25-35 MG-MCG tablet Take 1 tablet by mouth daily.   Yes [provider]  QNASL 80 MCG/ACT AERS Place 1 spray into both nostrils daily.  07/30/18  Yes [provider]  Acetaminophen-Caff-Pyrilamine (MIDOL COMPLETE PO) Take 2 tablets by mouth 2 (two) times daily as needed.    [provider]  benzonatate (TESSALON) 200 MG capsule Take 1 capsule (200 mg total) by mouth 3 (three) times daily as needed for up to 7 days for cough. 03/07/20 03/14/20  Rilley Stash C, PA-C  diphenhydrAMINE (BENADRYL) 25 MG tablet Take 25 mg by mouth every 6 (six) hours as needed.    [provider]  erythromycin with ethanol (EMGEL) 2 % gel Apply 1 application topically daily as needed.    [provider]  ibuprofen (ADVIL,MOTRIN) 200 MG tablet Take 400 mg by mouth every 6 (six) hours as needed for mild pain or moderate pain.    [provider]  predniSONE (DELTASONE) 20 MG tablet Take 2 tablets (40 mg total) by mouth daily for 5 days. 03/07/20 03/12/20  Isao Seltzer, Junius Creamer, PA-C    Family History Family History  Problem Relation Age of Onset  . Migraines Mother   . Anxiety disorder Mother   . Depression Mother   . ADD / ADHD Father   . Diabetes Paternal Grandfather   . Hypertension Paternal Grandfather   . Heart disease Paternal Grandfather   . Hyperlipidemia Paternal Grandfather   . Anxiety disorder Maternal Grandfather   . Depression Maternal Grandfather   . Drug abuse Maternal Grandfather   . Hyperlipidemia Maternal Grandfather   . Hypertension Maternal Grandfather   . Stroke Maternal Grandfather   . Heart disease Maternal Grandfather   . Anxiety disorder Maternal Grandmother   . Hyperlipidemia Paternal Grandmother   . Suicidality Neg Hx     Social History Social History   Tobacco Use  . Smoking status: Never Smoker  . Smokeless tobacco: Never Used  Vaping Use  . Vaping Use: Never used  Substance Use Topics  . Alcohol use: No    Alcohol/week: 0.0 standard drinks    Comment: rare- last drank summer of 2016- usually 1 drink per episode  . Drug use: No     Allergies   Prozac [fluoxetine hcl], Adhesive [tape], Codeine, Latex, Mobic [meloxicam], Neosporin [neomycin-bacitracin zn-polymyx], Amoxicillin, and Penicillins   Review of Systems Review of Systems  Constitutional: Negative for activity change, appetite change, chills, fatigue and fever.  HENT: Positive for congestion, rhinorrhea and sore throat. Negative for ear pain, sinus pressure and trouble swallowing.   Eyes: Negative for discharge and redness.  Respiratory: Positive for cough. Negative for chest tightness and shortness of breath.   Cardiovascular: Negative for chest pain.  Gastrointestinal:  Negative for abdominal pain, diarrhea, nausea and vomiting.  Musculoskeletal: Negative for myalgias.  Skin: Negative for rash.  Neurological: Negative for dizziness, light-headedness and headaches.     Physical Exam Triage Vital Signs ED Triage Vitals  Enc Vitals Group     BP      Pulse      Resp      Temp      Temp src      SpO2      Weight      Height      Head Circumference      Peak Flow      Pain Score      Pain Loc      Pain Edu?      Excl. in GC?    No data found.  Updated Vital Signs BP 126/87 (BP Location: Left Arm)   Pulse (!) 108  Temp 98 F (36.7 C) (Oral)   Resp 18   LMP 02/09/2020   SpO2 99%   Visual Acuity Right Eye Distance:   Left Eye Distance:   Bilateral Distance:    Right Eye Near:   Left Eye Near:    Bilateral Near:     Physical Exam Vitals and nursing note reviewed.  Constitutional:      Appearance: She is well-developed and well-nourished.     Comments: No acute distress  HENT:     Head: Normocephalic and atraumatic.     Ears:     Comments: Bilateral ears without tenderness to palpation of external auricle, tragus and mastoid, EAC's without erythema or swelling, TM's with good bony landmarks and cone of light. Non erythematous.     Nose: Nose normal.     Mouth/Throat:     Comments: Oral mucosa pink and moist, no tonsillar enlargement or exudate. Posterior pharynx patent and nonerythematous, no uvula deviation or swelling. Normal phonation. Eyes:     Conjunctiva/sclera: Conjunctivae normal.  Cardiovascular:     Rate and Rhythm: Tachycardia present.  Pulmonary:     Effort: Pulmonary effort is normal. No respiratory distress.     Comments: Breathing comfortably at rest, CTABL, no wheezing, rales or other adventitious sounds auscultated Abdominal:     General: There is no distension.  Musculoskeletal:        General: Normal range of motion.     Cervical back: Neck supple.  Skin:    General: Skin is warm and dry.   Neurological:     Mental Status: She is alert and oriented to person, place, and time.  Psychiatric:        Mood and Affect: Mood and affect normal.      UC Treatments / Results  Labs (all labs ordered are listed, but only abnormal results are displayed) Labs Reviewed  COVID-19, FLU A+B AND RSV  CULTURE, GROUP A STREP [facility])  POCT RAPID STREP A (OFFICE)    EKG   Radiology No results found.  Procedures Procedures (including critical care time)  Medications Ordered in UC Medications - No data to display  Initial Impression / Assessment and Plan / UC Course  I have reviewed the triage vital signs and the nursing notes.  Pertinent labs & imaging results that were available during my care of the patient were reviewed by me and considered in my medical decision making (see chart for details).     Viral URI with cough-strep test negative, culture pending.  Covid and flu test pending.  Recommending symptomatic and supportive care rest and fluids.  Lungs clear to auscultation at this time, but did report some shortness of breath and tightness in setting of asthma, will provide prednisone course to use as needed for asthma especially if not continuing to be improved with inhalers nebulizers alone.  Continue symptomatic and supportive care of cough and congestion.  Discussed strict return precautions. Patient verbalized understanding and is agreeable with plan.  Final Clinical Impressions(s) / UC Diagnoses   Final diagnoses:  Viral URI with cough  Moderate persistent asthma without complication     Discharge Instructions     Strep test was negative, Covid/flu test pending Rest and fluids Tylenol and ibuprofen for sore throat, headaches Continue xyzal, albuterol inhaler/nebulizers as needed for shortness of breath, chest tightness May continue Mucinex to further help loosen mucus in chest May use Tessalon for cough Prednisone course provided to use for asthma if inhalers  and nebulizers  alone not improving shortness of breath and chest tightness  Please follow-up if any symptoms not improving or worsening    ED Prescriptions    Medication Sig Dispense Auth. Provider   predniSONE (DELTASONE) 20 MG tablet Take 2 tablets (40 mg total) by mouth daily for 5 days. 10 tablet Dublin Grayer C, PA-C   benzonatate (TESSALON) 200 MG capsule Take 1 capsule (200 mg total) by mouth 3 (three) times daily as needed for up to 7 days for cough. 28 capsule Tascha Casares, Garnet C, PA-C     PDMP not reviewed this encounter.   Sharyon Cable Ludden C, PA-C 03/07/20 [phone]

## 2020-03-09 LAB — COVID-19, FLU A+B AND RSV
Influenza A, NAA: NOT DETECTED
Influenza B, NAA: NOT DETECTED
RSV, NAA: NOT DETECTED
SARS-CoV-2, NAA: NOT DETECTED

## 2020-03-10 LAB — CULTURE, GROUP A STREP (THRC)

## 2020-09-07 IMAGING — MR MR WRIST*L* W/O CM
4 of 6 series · 20 of 40 positions shown · non-contrast
Comparison: None.

CLINICAL DATA: Left wrist pain since the patient fell while skating
03/19/2018. Initial encounter.

EXAM:
MR OF THE LEFT WRIST WITHOUT CONTRAST
TECHNIQUE: Multiplanar, multisequence MR imaging of the left wrist was
performed. No intravenous contrast was administered.

[Series 6: T2 fat-sat · coronal · 3.0mm · 0.23mm/px · 3 of 14 slices shown (1 of 2)]
[im 1/14]
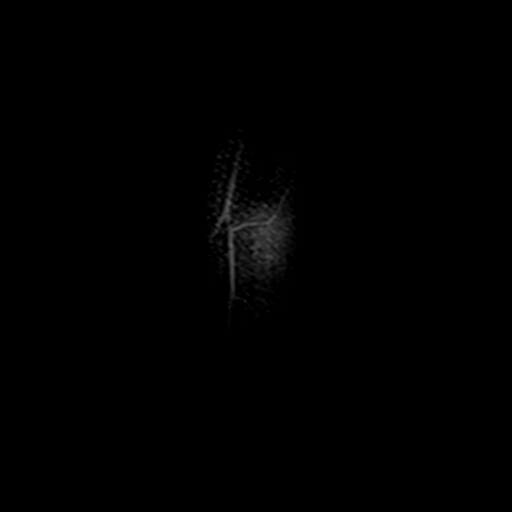
[im 7/14]
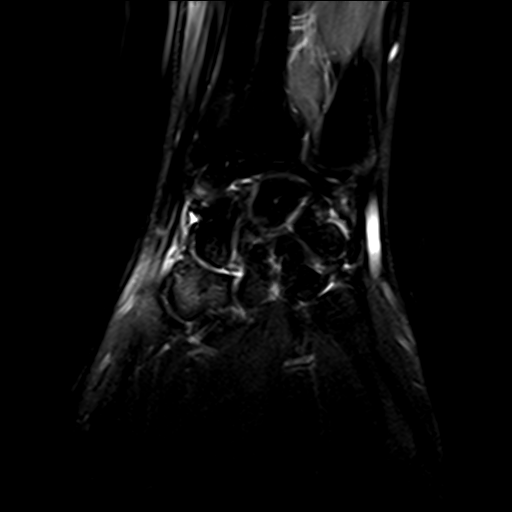
[im 14/14]
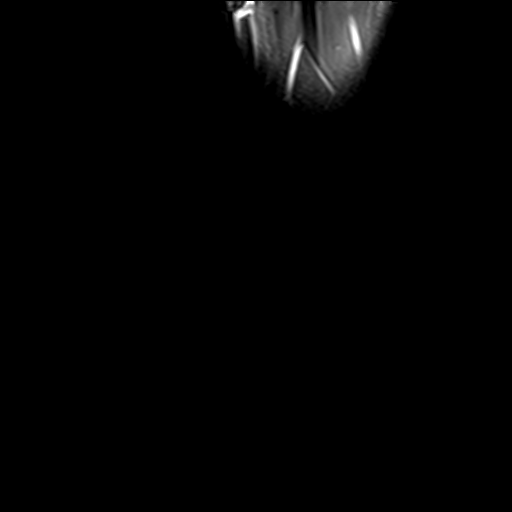

[Series 7: PD fat-sat · coronal · 3.0mm · 0.23mm/px · 5 of 14 slices shown (1 of 2)]
[im 1/14]
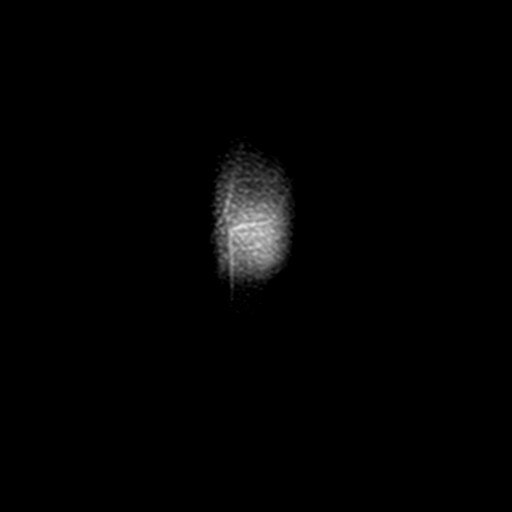
[im 4/14]
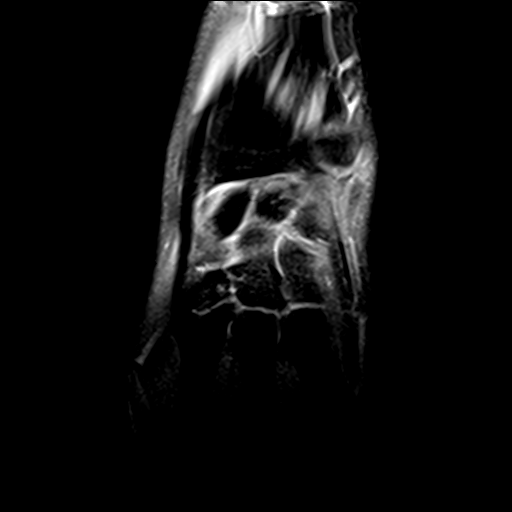
[im 7/14]
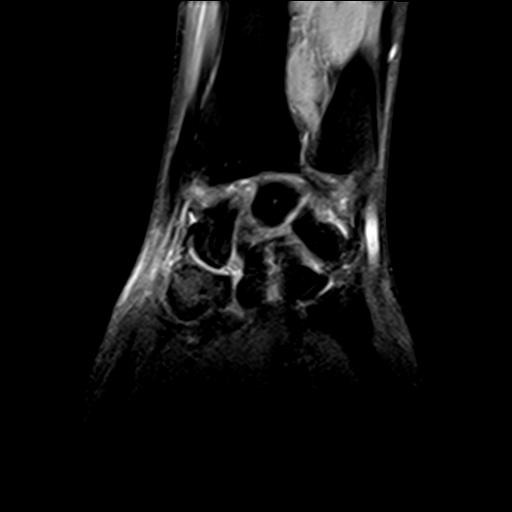
[im 10/14]
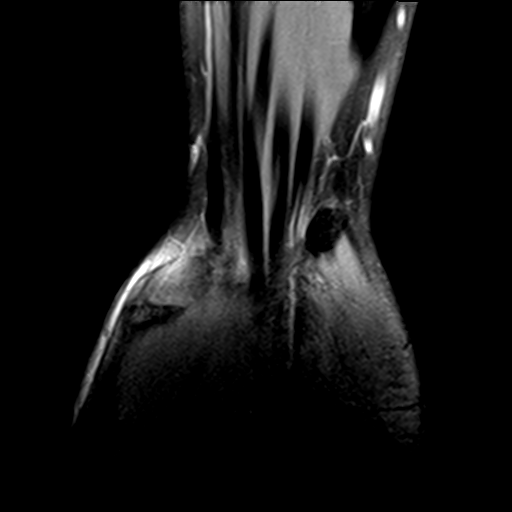
[im 14/14]
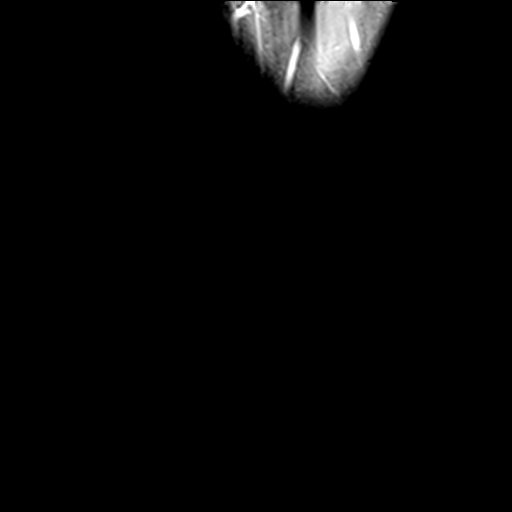

[Series 8: PD fat-sat · sagittal · 3.0mm · 0.23mm/px · 9 of 23 slices shown (2 of 2)]
[im 1/23]
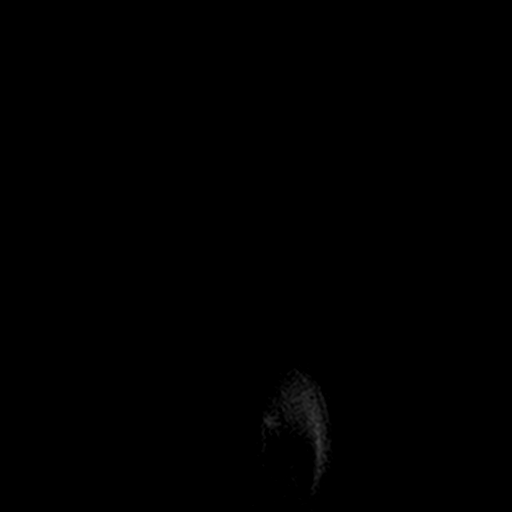
[im 3/23]
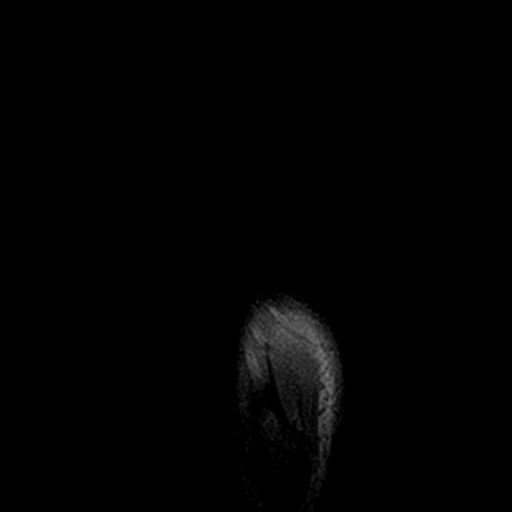
[im 6/23]
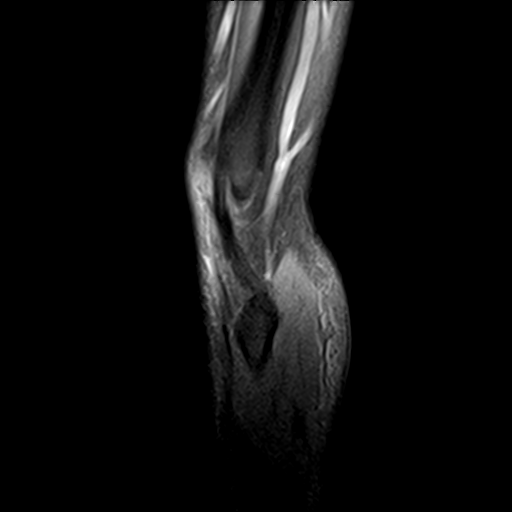
[im 9/23]
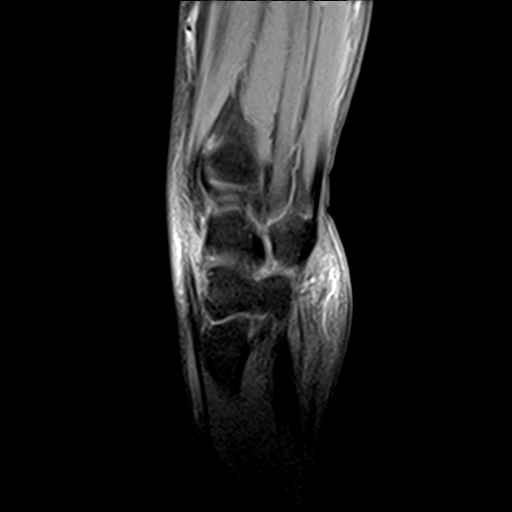
[im 12/23]
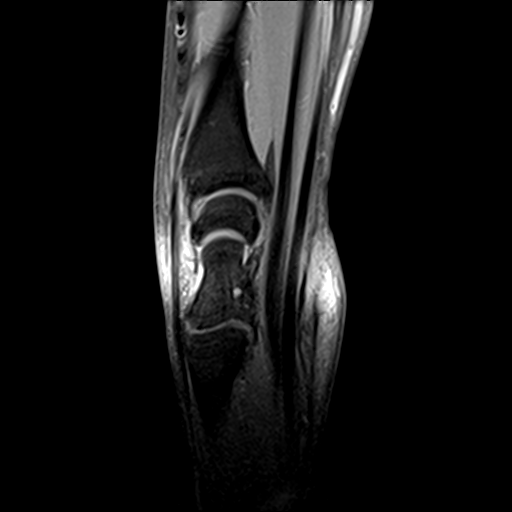
[im 14/23]
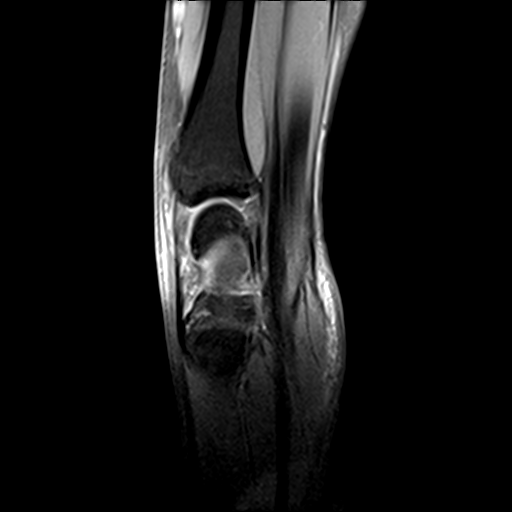
[im 17/23]
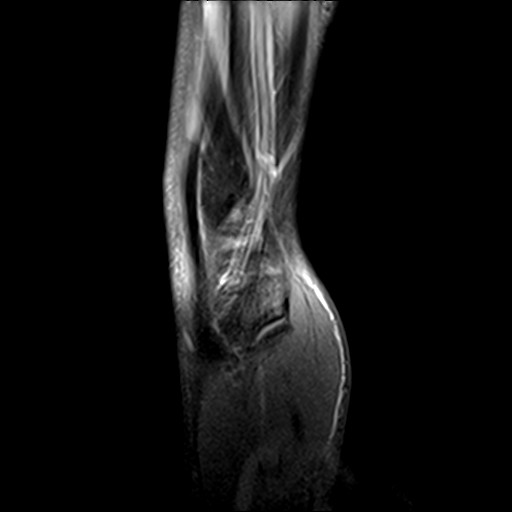
[im 20/23]
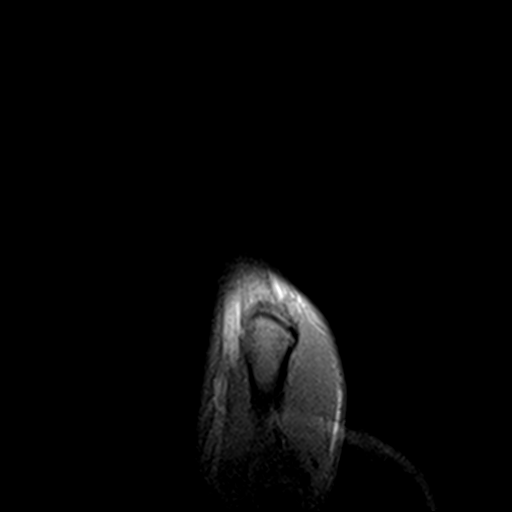
[im 23/23]
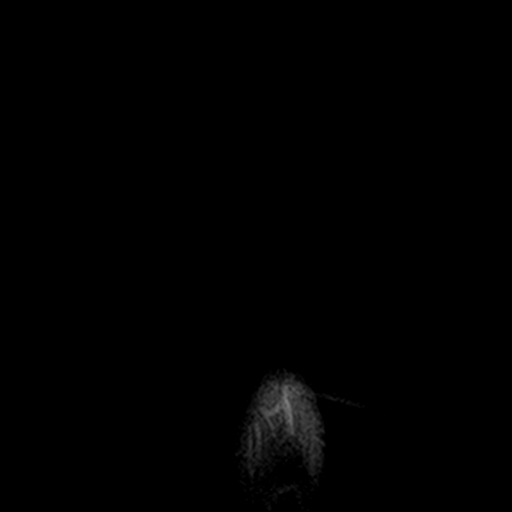

[Series 9: T2 fat-sat · axial · 3.0mm · 0.23mm/px · z∈[-40,+10]mm · 3 of 21 slices shown (2 of 2)]
[im 3/21]
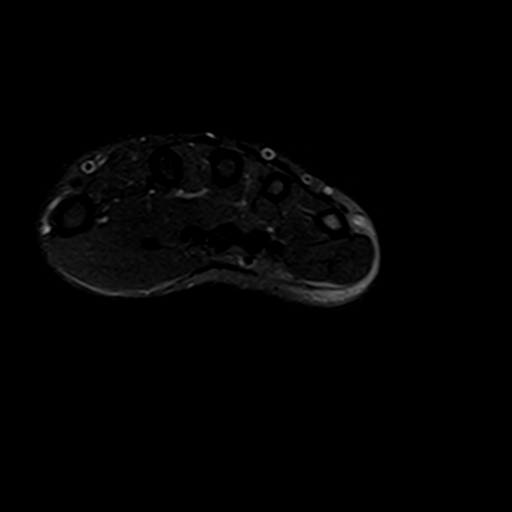
[im 12/21]
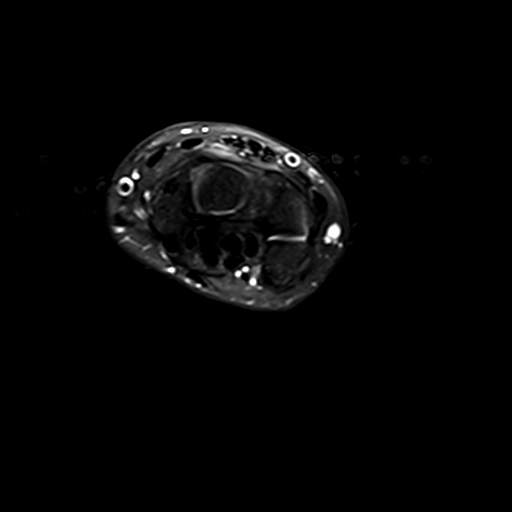
[im 18/21]
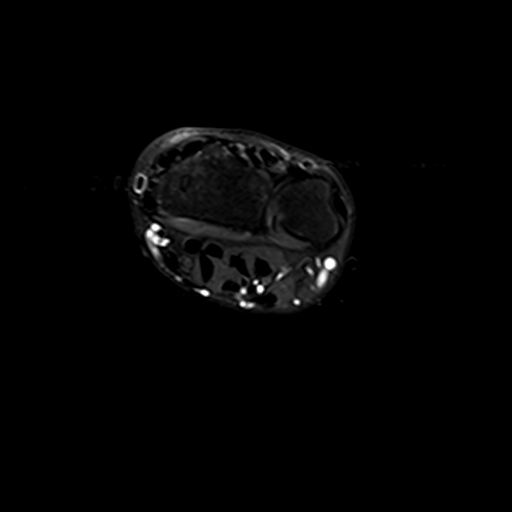

[20 of 40 positions shown; findings below may reference images not displayed]

FINDINGS: Ligaments: Intact.

Triangular fibrocartilage: Intact.

Tendons: Intact.

Carpal tunnel/median nerve: Normal.

Guyon's canal: Normal.

Joint/cartilage: Normal.

Bones/carpal alignment: Normal marrow signal throughout.

Other: None.
IMPRESSION: Normal MRI left wrist.

## 2020-09-19 NOTE — Progress Notes (Signed)
Cardiology Office Note:    Date:  09/20/2020   ID:  Kimberly Solomon, DOB 03/30/1995, MRN [id number]  PCP:  Patient, No Pcp Per (Inactive)  Cardiologist:  None  Electrophysiologist:  None   Referring MD: No ref. provider found   Chief Complaint  Patient presents with   Dizziness     History of Present Illness:    Kimberly Solomon is a 25 y.o. female with a hx of ADHD, anxiety, asthma, depression who presents for follow-up visit of tachycardia and chest pain.  She was initially seen on 11/26/2018.  She was seen by her PCP and noted to be tachycardic to heart rate 118.  She takes amphetamine ER for her ADHD.  Thyroid studies were normal.  She reports she has been having chest tightness with exertion.  She reported that it began this year.  Occurs with running, states that 2-4 minutes into jogging will have chest pain.  Also with shortness of breath and lightheadedness. One episode of syncope when she was in 8th grade, was in chorus and standing and felt like she was going to black out before she passed out.  She has been checking her HR with a pulse-ox, has been up to 150s with just walking.  States that she has episodes where feels like heart is racing, occurs about once every 2 days.  Will last for few minutes but can be up to 20 minutes. Feels lightheadedess and and has chest pain during episodes.  No history of heart disease in immediate family.  Given her exertional chest pain, coronary CTA was ordered to evaluate for coronary artery anomalies and TTE was ordered to evaluate for structural heart disease.  Both studies showed no abnormalities.  Given her palpitations, Zio patch was ordered for 1 week, which showed one 7 beat run of NSVT versus SVT with aberrancy.  Her triggered events corresponded to sinus rhythm.  ETT on 02/26/2019 showed no evidence of ischemia or arrhythmia with stress.  Since last clinic visit, she reports she has been doing okay.  Chest pain has improved, occurring about  once per month.  She reports he is having issues with lightheadedness every day.  Denies any syncopal episodes.  Notices lightheadedness when changing positions, particular when she has been sitting for a while.  Also reports tachycardia with this, symptoms typically last for less than 1 minute.  She has been going to the gym and doing weight training.  Notices improvement in her symptoms since she started this.  She drinks about 2 L of fluid per day.  Past Medical History:  Diagnosis Date   ADHD (attention deficit hyperactivity disorder)    Allergy    Anxiety    Asthma    Depression    Eating disorder    Mental disorder     Past Surgical History:  Procedure Laterality Date   ADENOIDECTOMY  1999   KNEE SURGERY Left    TYMPANOTOMY  1999   WISDOM TOOTH EXTRACTION  2015    Current Medications: Current Meds  Medication Sig   Acetaminophen-Caff-Pyrilamine (MIDOL COMPLETE PO) Take 2 tablets by mouth 2 (two) times daily as needed.   albuterol (PROVENTIL HFA;VENTOLIN HFA) 108 (90 BASE) MCG/ACT inhaler Inhale 2 puffs into the lungs every 6 (six) hours as needed for wheezing or shortness of breath.   Amphetamine ER (ADZENYS XR-ODT) 18.8 MG TBED Take by mouth daily.   desvenlafaxine (PRISTIQ) 50 MG 24 hr tablet Take 50 mg by mouth daily.  diphenhydrAMINE (BENADRYL) 25 MG tablet Take 25 mg by mouth every 6 (six) hours as needed.   ferrous sulfate 325 (65 FE) MG tablet Take 325 mg by mouth daily with breakfast.   fluticasone furoate-vilanterol (BREO ELLIPTA) 100-25 MCG/INH AEPB TAKE 1 PUFF BY MOUTH EVERY DAY   guanFACINE (TENEX) 2 MG tablet Take 2 mg by mouth at bedtime.   hydrOXYzine (VISTARIL) 50 MG capsule Take 50 mg by mouth in the morning and at bedtime. Takes 1 time up to 3 times daily as needed   ibuprofen (ADVIL,MOTRIN) 200 MG tablet Take 400 mg by mouth every 6 (six) hours as needed for mild pain or moderate pain.   levocetirizine (XYZAL) 5 MG tablet Take 5 mg by mouth daily.    Melatonin 5 MG CAPS Take by mouth.   montelukast (SINGULAIR) 10 MG tablet TAKE 1 TABLET BY MOUTH EVERYDAY AT BEDTIME   norgestimate-ethinyl estradiol (ORTHO-CYCLEN,SPRINTEC,PREVIFEM) 0.25-35 MG-MCG tablet Take 1 tablet by mouth daily.   QNASL 80 MCG/ACT AERS Place 1 spray into both nostrils daily.     Allergies:   Prozac [fluoxetine hcl], Adhesive [tape], Codeine, Latex, Mobic [meloxicam], Neosporin [neomycin-bacitracin zn-polymyx], Amoxicillin, and Penicillins   Social History   Socioeconomic History   Marital status: Single    Spouse name: Not on file   Number of children: 0   Years of education: Not on file   Highest education level: Not on file  Occupational History   Occupation: Student  Tobacco Use   Smoking status: Never   Smokeless tobacco: Never  Vaping Use   Vaping Use: Never used  Substance and Sexual Activity   Alcohol use: No    Alcohol/week: 0.0 standard drinks    Comment: rare- last drank summer of 2016- usually 1 drink per episode   Drug use: No   Sexual activity: Yes    Birth control/protection: Pill, Condom  Other Topics Concern   Not on file  Social History Narrative   Born in [place] but raised in [place] by both parents. Pt has one older sister. Pt goes to [employer] and lives alone. She is studying education. Single, never married, no kids.    Social Determinants of Health   Financial Resource Strain: Not on file  Food Insecurity: Not on file  Transportation Needs: Not on file  Physical Activity: Not on file  Stress: Not on file  Social Connections: Not on file     Family History: The patient's family history includes ADD / ADHD in her father; Anxiety disorder in her maternal grandfather, maternal grandmother, and mother; Depression in her maternal grandfather and mother; Diabetes in her paternal grandfather; Drug abuse in her maternal grandfather; Heart disease in her maternal grandfather and paternal grandfather; Hyperlipidemia in her  maternal grandfather, paternal grandfather, and paternal grandmother; Hypertension in her maternal grandfather and paternal grandfather; Migraines in her mother; Stroke in her maternal grandfather. There is no history of Suicidality.  ROS:   Please see the history of present illness.    All other systems reviewed and are negative.  EKGs/Labs/Other Studies Reviewed:    The following studies were reviewed today:   EKG:  EKG is  ordered today.  The ekg ordered today demonstrates sinus rhythm, rate 87, no ST/T abnormality  TTE 12/17/18:  1. Left ventricular ejection fraction, by visual estimation, is 55 to 60%. The left ventricle has normal function. Normal left ventricular size. There is no left ventricular hypertrophy.  2. Global right ventricle has normal systolic function.The  right ventricular size is normal. No increase in right ventricular wall thickness.  3. Left atrial size was normal.  4. Right atrial size was normal.  5. Trivial pericardial effusion is present.  6. The mitral valve is normal in structure. Trace mitral valve regurgitation. No evidence of mitral stenosis.  7. The tricuspid valve is normal in structure. Tricuspid valve regurgitation is mild.  8. The aortic valve is tricuspid Aortic valve regurgitation was not visualized by color flow Doppler. Structurally normal aortic valve, with no evidence of sclerosis or stenosis.  9. The pulmonic valve was normal in structure. Pulmonic valve regurgitation is not visualized by color flow Doppler. 10. Normal pulmonary artery systolic pressure. 11. The inferior vena cava is normal in size with greater than 50% respiratory variability, suggesting right atrial pressure of 3 mmHg.  Coronary CTA 12/29/18: 1. Coronary calcium score of 0. This was 0 percentile for age and sex matched control. 2. Normal coronary origin with right dominance. No coronary anomalies 3. No evidence of CAD.  Cardiac monitor 12/24/18: 7 days of data recorded  on Zio monitor. Patient had a min HR of 46 bpm, max HR of 165 bpm, and avg HR of 88 bpm. Predominant underlying rhythm was Sinus Rhythm. No SVT, atrial fibrillation, high degree block, or pauses noted. There was a 7 beat run of NSVT (vs SVT with aberrancy). Isolated atrial and ventricular ectopy was rare (<1%). There were 17 triggered events, corresponding to sinus rhythm and occasional ventricular ectopy.  No significant arrhythmias detected.   Recent Labs: No results found for requested labs within last 8760 hours.  Recent Lipid Panel No results found for: CHOL, TRIG, HDL, CHOLHDL, VLDL, LDLCALC, LDLDIRECT  Physical Exam:    VS:  BP 110/82 (BP Location: Left Arm, Patient Position: Sitting, Cuff Size: Normal)   Pulse 87   Ht 5\' 7"  (1.702 m)   Wt 142 lb 12.8 oz (64.8 kg)   SpO2 100%   BMI 22.37 kg/m     Wt Readings from Last 3 Encounters:  09/20/20 142 lb 12.8 oz (64.8 kg)  02/08/20 139 lb 12.8 oz (63.4 kg)  02/04/19 146 lb (66.2 kg)     GEN: Well nourished, well developed in no acute distress HEENT: Normal NECK: No JVD; No carotid bruits LYMPHATICS: No lymphadenopathy CARDIAC: RRR, no murmurs, rubs, gallops RESPIRATORY:  Clear to auscultation without rales, wheezing or rhonchi  ABDOMEN: Soft, non-tender, non-distended MUSCULOSKELETAL:  No edema; No deformity  SKIN: Warm and dry NEUROLOGIC:  Alert and oriented x 3 PSYCHIATRIC:  Normal affect   ASSESSMENT:    1. Lightheaded   2. Palpitations   3. Tachycardia   4. Chest pain of uncertain etiology   5. NSVT (nonsustained ventricular tachycardia) (HCC)     PLAN:    Chest pain: reports exertional chest pain.    TTE showed no evidence of structural heart disease and coronary CTA showed normal coronary origins.  Suspect noncardiac etiology.  Reports symptoms have improved.  Lightheadedness: No structural heart disease on echocardiogram.  Orthostatics at prior clinic visit show significant increase in heart rate with  standing, concerning for POTS.  Advised to stay well-hydrated.  Recommend compression stockings.  Discussed that the best long term management of POTS symptoms is gradual exercise conditioning. Recommend seated exercises such as bike to start, to avoid the risk of falling with lightheadedness   Palpitations: reports episodes where feels like heart is racing, was occurs ~every other day, can last up to 20 minutes.  Amphetamine ER that she takes for ADHD is a potential cause, but she is not interested in stopping or changing.  Triggered events on monitor corresponded to sinus rhythm.  She reports palpitations have improved, suspect related to POTS as above   Resting tachycardia: likely related to amphetamine ER she takes for ADHD.  Cardiac monitor shows that heart rate does not remain elevated during the night, arguing against inappropriate sinus tachycardia  NSVT: one 7 beat run of NSVT versus SVT with aberrancy on cardiac monitor.  No evidence of structural heart disease on TTE.  Discussed that definitive test to rule out any structural heart disease, particularly to evaluate the right ventricle, would be cardiac MRI.  She reports she has significant claustrophobia and has required significant sedation for MRIs in the past, so does not think she could cooperate with breath holds to get a cardiac MRI.  ETT on 02/26/2019 showed no evidence of ischemia or arrhythmia with stress.  RTC in 1 year   Medication Adjustments/Labs and Tests Ordered: Current medicines are reviewed at length with the patient today.  Concerns regarding medicines are outlined above.  Orders Placed This Encounter  Procedures   EKG 12-Lead    No orders of the defined types were placed in this encounter.   Patient Instructions  Medication Instructions:  Your physician recommends that you continue on your current medications as directed. Please refer to the Current Medication list given to you today.  *If you need a refill on  your cardiac medications before your next appointment, please call your pharmacy*  Follow-Up: At [facility], you and your health needs are our priority.  As part of our continuing mission to provide you with exceptional heart care, we have created designated Provider Care Teams.  These Care Teams include your primary Cardiologist (physician) and Advanced Practice Providers (APPs -  Physician Assistants and Nurse Practitioners) who all work together to provide you with the care you need, when you need it.  We recommend signing up for the patient portal called "MyChart".  Sign up information is provided on this After Visit Summary.  MyChart is used to connect with patients for Virtual Visits (Telemedicine).  Patients are able to view lab/test results, encounter notes, upcoming appointments, etc.  Non-urgent messages can be sent to your provider as well.   To learn more about what you can do with MyChart, go to [email or web address].    Your next appointment:   12 month(s)  The format for your next appointment:   In Person  Provider:   Epifanio Lesches, MD   Signed, Little Ishikawa, MD  09/20/2020 9:26 AM    [facility]

## 2020-09-20 ENCOUNTER — Encounter: Payer: Self-pay | Admitting: Cardiology

## 2020-09-20 ENCOUNTER — Other Ambulatory Visit: Payer: Self-pay

## 2020-09-20 ENCOUNTER — Ambulatory Visit (INDEPENDENT_AMBULATORY_CARE_PROVIDER_SITE_OTHER): Payer: 59 | Admitting: Cardiology

## 2020-09-20 VITALS — BP 110/82 | HR 87 | Ht 67.0 in | Wt 142.8 lb

## 2020-09-20 DIAGNOSIS — I4729 Other ventricular tachycardia: Secondary | ICD-10-CM

## 2020-09-20 DIAGNOSIS — R079 Chest pain, unspecified: Secondary | ICD-10-CM

## 2020-09-20 DIAGNOSIS — I472 Ventricular tachycardia: Secondary | ICD-10-CM

## 2020-09-20 DIAGNOSIS — R42 Dizziness and giddiness: Secondary | ICD-10-CM

## 2020-09-20 DIAGNOSIS — R002 Palpitations: Secondary | ICD-10-CM

## 2020-09-20 DIAGNOSIS — R000 Tachycardia, unspecified: Secondary | ICD-10-CM

## 2020-09-20 NOTE — Patient Instructions (Signed)

## 2020-11-07 ENCOUNTER — Telehealth: Payer: Self-pay | Admitting: Cardiology

## 2020-11-07 NOTE — Telephone Encounter (Signed)
Returned call to Pt's mother.  She reports Pt has had an increase in POTS symptoms-increased lightheadedness, gets hot and then sweaty with chills.  Fast heart rate.  Feels "heavy.  Also has had a headache.  Per mother-Pt works in a [employer] that gets very warm, and Pt will sweat perfusely and then have symptoms.  Per mother, Pt is drinking plenty of fluids and trying to increase her salt intake (Pt does not really like salt).  Encouraged mother to have Pt increase salt and fluids, especially since Pt works in a hot environment.  She also works with cheerleading in the afternoon.  This nurse read "POTS" package to Pt's mother, explaining symptoms of POTS and treatment.  Pt is not currently using compression stockings d/t the heat.  Gave website [email or web address] to mother-website with medical information.  Advised would send message to Dr. Bjorn Pippin and would return call if any further advisement.  Mother thanked nurse for call.

## 2020-11-07 NOTE — Telephone Encounter (Signed)
Pt's mother is calling with concerns about her daughters health

## 2020-11-08 ENCOUNTER — Ambulatory Visit: Payer: 59 | Admitting: Cardiology

## 2020-11-08 NOTE — Telephone Encounter (Signed)
Agree with plan, would also try using compression stockings while at work if she is able

## 2020-11-11 NOTE — Telephone Encounter (Signed)
Mother updated and verbalized understanding.

## 2020-11-11 NOTE — Telephone Encounter (Signed)
Left message to call back  

## 2020-11-11 NOTE — Telephone Encounter (Signed)
Pt's mother is returning call from earlier today.Per mother, Pt is doing much better and she will contact our office if anything else occurs.

## 2021-01-19 ENCOUNTER — Telehealth: Payer: Self-pay | Admitting: Cardiology

## 2021-01-19 NOTE — Telephone Encounter (Signed)
Pt c/o of Chest Pain: STAT if CP now or developed within 24 hours  1. Are you having CP right now? Yes under left breast   2. Are you experiencing any other symptoms (ex. SOB, nausea, vomiting, sweating)? Nausea, dizziness, pulse is fluctuating   3. How long have you been experiencing CP? 30 min   4. Is your CP continuous or coming and going? continuous  5. Have you taken Nitroglycerin? No  ?

## 2021-01-19 NOTE — Telephone Encounter (Signed)
Left message for pt to call.

## 2021-01-19 NOTE — Telephone Encounter (Signed)
Called pt's mother back and she is aware we would like the pt to start recording her blood pressure and heart rate, especially when she is having these episodes. Also made aware she should follow up with her PCP in regards to anxiety. She needs a list of PCPs. Will send in  a MyChart message. She verbalized understanding.

## 2021-01-19 NOTE — Telephone Encounter (Signed)
Called pt's mother, lower chest, under left breast. Pain stops 1-2 later lightheaded and nausea, dizziness. It has been going on for a couple of days, but all day today. "I'm not sure if it's POTs, anxiety or what. She seems to be doing okay pother than the discomfort."   Unsure what her recent blood pressure is. Mother would like the return call instead of the pt. Will get message over to DOD for advise.

## 2021-01-19 NOTE — Telephone Encounter (Signed)
Pt's mother returning call

## 2021-01-27 NOTE — Telephone Encounter (Signed)
Agree with recommendations.  

## 2021-03-26 NOTE — Progress Notes (Signed)
Cardiology Office Note:    Date:  03/30/2021   ID:  Kimberly Solomon, DOB 04/01/1995, MRN [id number]  PCP:  Patient, No Pcp Per (Inactive)  Cardiologist:  Little Ishikawa, MD  Electrophysiologist:  None   Referring MD: No ref. provider found   Chief Complaint  Patient presents with   Follow-up   Loss of Consciousness     History of Present Illness:    Kimberly Solomon is a 26 y.o. female with a hx of ADHD, anxiety, asthma, depression who presents for follow-up visit of tachycardia and chest pain.  She was initially seen on 11/26/2018.  She was seen by her PCP and noted to be tachycardic to heart rate 118.  She takes amphetamine ER for her ADHD.  Thyroid studies were normal.  She reports she has been having chest tightness with exertion.  She reported that it began this year.  Occurs with running, states that 2-4 minutes into jogging will have chest pain.  Also with shortness of breath and lightheadedness. One episode of syncope when she was in 8th grade, was in chorus and standing and felt like she was going to black out before she passed out.  She has been checking her HR with a pulse-ox, has been up to 150s with just walking.  States that she has episodes where feels like heart is racing, occurs about once every 2 days.  Will last for few minutes but can be up to 20 minutes. Feels lightheadedess and and has chest pain during episodes.  No history of heart disease in immediate family.  Given her exertional chest pain, coronary CTA was ordered to evaluate for coronary artery anomalies and TTE was ordered to evaluate for structural heart disease.  Both studies showed no abnormalities.  Given her palpitations, Zio patch was ordered for 1 week, which showed one 7 beat run of NSVT versus SVT with aberrancy.  Her triggered events corresponded to sinus rhythm.  ETT on 02/26/2019 showed no evidence of ischemia or arrhythmia with stress.  Since last clinic visit, she reports that she has had  syncopal episodes.  States that she been having lightheadedness with standing.  2 weeks ago she stood up and was walking up the stairs when she started to feel lightheaded and then sat down and passed out.  Felt like heart was racing.  States she was unconscious for less than 1 minute.  She is drinking about 2 L of fluid per day, usually drinks smart water but also drinks Gatorade if feeling lightheaded.  She has not been wearing compression stockings.  She has been salting her food.  She does weight training at gym 0 to 3 days/week for about 1 hour.  Past Medical History:  Diagnosis Date   ADHD (attention deficit hyperactivity disorder)    Allergy    Anxiety    Asthma    Depression    Eating disorder    Mental disorder     Past Surgical History:  Procedure Laterality Date   ADENOIDECTOMY  1999   KNEE SURGERY Left    TYMPANOTOMY  1999   WISDOM TOOTH EXTRACTION  2015    Current Medications: Current Meds  Medication Sig   Acetaminophen-Caff-Pyrilamine (MIDOL COMPLETE PO) Take 2 tablets by mouth 2 (two) times daily as needed.   albuterol (PROVENTIL HFA;VENTOLIN HFA) 108 (90 BASE) MCG/ACT inhaler Inhale 2 puffs into the lungs every 6 (six) hours as needed for wheezing or shortness of breath.   Amphetamine ER (ADZENYS  XR-ODT) 18.8 MG TBED Take by mouth daily.   desvenlafaxine (PRISTIQ) 50 MG 24 hr tablet Take 50 mg by mouth daily.   diphenhydrAMINE (BENADRYL) 25 MG tablet Take 25 mg by mouth every 6 (six) hours as needed.   ferrous sulfate 325 (65 FE) MG tablet Take 325 mg by mouth daily with breakfast.   guanFACINE (TENEX) 2 MG tablet Take 2 mg by mouth at bedtime.   hydrOXYzine (VISTARIL) 50 MG capsule Take 50 mg by mouth in the morning and at bedtime. Takes 1 time up to 3 times daily as needed   ibuprofen (ADVIL,MOTRIN) 200 MG tablet Take 400 mg by mouth every 6 (six) hours as needed for mild pain or moderate pain.   levocetirizine (XYZAL) 5 MG tablet Take 5 mg by mouth daily.    Melatonin 5 MG CAPS Take by mouth.   montelukast (SINGULAIR) 10 MG tablet TAKE 1 TABLET BY MOUTH EVERYDAY AT BEDTIME   norgestimate-ethinyl estradiol (ORTHO-CYCLEN,SPRINTEC,PREVIFEM) 0.25-35 MG-MCG tablet Take 1 tablet by mouth daily.   QNASL 80 MCG/ACT AERS Place 1 spray into both nostrils daily.   [DISCONTINUED] fluticasone furoate-vilanterol (BREO ELLIPTA) 100-25 MCG/INH AEPB TAKE 1 PUFF BY MOUTH EVERY DAY     Allergies:   Prozac [fluoxetine hcl], Adhesive [tape], Codeine, Latex, Mobic [meloxicam], Neosporin [neomycin-bacitracin zn-polymyx], Amoxicillin, and Penicillins   Social History   Socioeconomic History   Marital status: Single    Spouse name: Not on file   Number of children: 0   Years of education: Not on file   Highest education level: Not on file  Occupational History   Occupation: Student  Tobacco Use   Smoking status: Never   Smokeless tobacco: Never  Vaping Use   Vaping Use: Never used  Substance and Sexual Activity   Alcohol use: No    Alcohol/week: 0.0 standard drinks    Comment: rare- last drank summer of 2016- usually 1 drink per episode   Drug use: No   Sexual activity: Yes    Birth control/protection: Pill, Condom  Other Topics Concern   Not on file  Social History Narrative   Born in [place] but raised in [place] by both parents. Pt has one older sister. Pt goes to [employer] and lives alone. She is studying education. Single, never married, no kids.    Social Determinants of Health   Financial Resource Strain: Not on file  Food Insecurity: Not on file  Transportation Needs: Not on file  Physical Activity: Not on file  Stress: Not on file  Social Connections: Not on file     Family History: The patient's family history includes ADD / ADHD in her father; Anxiety disorder in her maternal grandfather, maternal grandmother, and mother; Depression in her maternal grandfather and mother; Diabetes in her paternal grandfather; Drug abuse in  her maternal grandfather; Heart disease in her maternal grandfather and paternal grandfather; Hyperlipidemia in her maternal grandfather, paternal grandfather, and paternal grandmother; Hypertension in her maternal grandfather and paternal grandfather; Migraines in her mother; Stroke in her maternal grandfather. There is no history of Suicidality.  ROS:   Please see the history of present illness.    All other systems reviewed and are negative.  EKGs/Labs/Other Studies Reviewed:    The following studies were reviewed today:   EKG:  EKG is not ordered today.  The ekg ordered at prior clinic visit demonstrates sinus rhythm, rate 87, no ST/T abnormality  TTE 12/17/18:  1. Left ventricular ejection fraction, by visual estimation,  is 55 to 60%. The left ventricle has normal function. Normal left ventricular size. There is no left ventricular hypertrophy.  2. Global right ventricle has normal systolic function.The right ventricular size is normal. No increase in right ventricular wall thickness.  3. Left atrial size was normal.  4. Right atrial size was normal.  5. Trivial pericardial effusion is present.  6. The mitral valve is normal in structure. Trace mitral valve regurgitation. No evidence of mitral stenosis.  7. The tricuspid valve is normal in structure. Tricuspid valve regurgitation is mild.  8. The aortic valve is tricuspid Aortic valve regurgitation was not visualized by color flow Doppler. Structurally normal aortic valve, with no evidence of sclerosis or stenosis.  9. The pulmonic valve was normal in structure. Pulmonic valve regurgitation is not visualized by color flow Doppler. 10. Normal pulmonary artery systolic pressure. 11. The inferior vena cava is normal in size with greater than 50% respiratory variability, suggesting right atrial pressure of 3 mmHg.  Coronary CTA 12/29/18: 1. Coronary calcium score of 0. This was 0 percentile for age and sex matched control. 2. Normal  coronary origin with right dominance. No coronary anomalies 3. No evidence of CAD.  Cardiac monitor 12/24/18: 7 days of data recorded on Zio monitor. Patient had a min HR of 46 bpm, max HR of 165 bpm, and avg HR of 88 bpm. Predominant underlying rhythm was Sinus Rhythm. No SVT, atrial fibrillation, high degree block, or pauses noted. There was a 7 beat run of NSVT (vs SVT with aberrancy). Isolated atrial and ventricular ectopy was rare (<1%). There were 17 triggered events, corresponding to sinus rhythm and occasional ventricular ectopy.  No significant arrhythmias detected.    Recent Labs: No results found for requested labs within last 8760 hours.  Recent Lipid Panel No results found for: CHOL, TRIG, HDL, CHOLHDL, VLDL, LDLCALC, LDLDIRECT  Physical Exam:    VS:  BP (!) 118/92 (BP Location: Right Arm, Patient Position: Sitting, Cuff Size: Normal)    Pulse 96    Ht 5\' 7"  (1.702 m)    Wt 147 lb (66.7 kg)    BMI 23.02 kg/m     Wt Readings from Last 3 Encounters:  03/30/21 147 lb (66.7 kg)  09/20/20 142 lb 12.8 oz (64.8 kg)  02/08/20 139 lb 12.8 oz (63.4 kg)     GEN: Well nourished, well developed in no acute distress HEENT: Normal NECK: No JVD; No carotid bruits CARDIAC: RRR, no murmurs, rubs, gallops RESPIRATORY:  Clear to auscultation without rales, wheezing or rhonchi  ABDOMEN: Soft, non-tender, non-distended MUSCULOSKELETAL:  No edema; No deformity  SKIN: Warm and dry NEUROLOGIC:  Alert and oriented x 3 PSYCHIATRIC:  Normal affect   ASSESSMENT:    1. Syncope and collapse   2. Chest pain of uncertain etiology   3. Palpitations   4. NSVT (nonsustained ventricular tachycardia)   5. Tachycardia      PLAN:    Lightheadedness: No structural heart disease on echocardiogram.  Orthostatics at prior clinic visit show significant increase in heart rate with standing, concerning for POTS.  Advised to stay well-hydrated, goal 3L fluids per day.  Recommend compression stockings.   Discussed that the best long term management of POTS symptoms is gradual exercise conditioning. Recommend seated exercises such as bike to start, to avoid the risk of falling with lightheadedness -Given recent syncopal episode, recommend checking Zio patch x 2 weeks to rule out arrhythmia -I did discuss the [facility] DMV medical guidelines for  driving: "it is prudent to recommend that all persons should be free of syncopal episodes for at least six months to be granted the driving privilege." (THE [place] PHYSICIAN'S GUIDE TO DRIVER MEDICAL EVALUATION, Second Edition, Medical Review Branch, Associate ProfessorDriver License Section, Division of [employer], [employer] [place] Department of Transportation, July 2004)  Chest pain: reports exertional chest pain.    TTE showed no evidence of structural heart disease and coronary CTA showed normal coronary origins.  Suspect noncardiac etiology.  Reports symptoms have improved.   Palpitations: reports episodes where feels like heart is racing, was occurs ~every other day, can last up to 20 minutes.  Amphetamine ER that she takes for ADHD is a potential cause, but she is not interested in stopping or changing.  Triggered events on monitor corresponded to sinus rhythm.  She reports palpitations have improved, suspect related to POTS as above   Resting tachycardia: likely related to amphetamine ER she takes for ADHD.  Cardiac monitor shows that heart rate does not remain elevated during the night, arguing against inappropriate sinus tachycardia  NSVT: one 7 beat run of NSVT versus SVT with aberrancy on cardiac monitor.  No evidence of structural heart disease on TTE.  Discussed that definitive test to rule out any structural heart disease, particularly to evaluate the right ventricle, would be cardiac MRI.  She reports she has significant claustrophobia and has required significant sedation for MRIs in the past, so does not think she could cooperate with breath holds to get a cardiac  MRI.  ETT on 02/26/2019 showed no evidence of ischemia or arrhythmia with stress.  Repeat Zio patch given recent syncopal episode as above  RTC in 6 months   Medication Adjustments/Labs and Tests Ordered: Current medicines are reviewed at length with the patient today.  Concerns regarding medicines are outlined above.  Orders Placed This Encounter  Procedures   LONG TERM MONITOR (3-14 DAYS)    No orders of the defined types were placed in this encounter.    Patient Instructions  Medication Instructions:  Your physician recommends that you continue on your current medications as directed. Please refer to the Current Medication list given to you today.  *If you need a refill on your cardiac medications before your next appointment, please call your pharmacy*   Lab Work: None If you have labs (blood work) drawn today and your tests are completely normal, you will receive your results only by: MyChart Message (if you have MyChart) OR A paper copy in the mail If you have any lab test that is abnormal or we need to change your treatment, we will call you to review the results.   Testing/Procedures: Christena DeemZIO XT- Long Term Monitor Instructions  Your physician has requested you wear a ZIO patch monitor for 14 days.  This is a single patch monitor. [employer] supplies one patch monitor per enrollment. Additional stickers are not available. Please do not apply patch if you will be having a Nuclear Stress Test,  Echocardiogram, Cardiac CT, MRI, or Chest Xray during the period you would be wearing the  monitor. The patch cannot be worn during these tests. You cannot remove and re-apply the  ZIO XT patch monitor.  Your ZIO patch monitor will be mailed 3 day USPS to your address on file. It may take 3-5 days  to receive your monitor after you have been enrolled.  Once you have received your monitor, please review the enclosed instructions. Your monitor  has already been registered assigning  a  specific monitor serial # to you.  Billing and Patient Assistance Program Information  We have supplied [employer] with any of your insurance information on file for billing purposes. [employer] offers a sliding scale Patient Assistance Program for patients that do not have  insurance, or whose insurance does not completely cover the cost of the ZIO monitor.  You must apply for the Patient Assistance Program to qualify for this discounted rate.  To apply, please call [employer] at [phone], select option 4, select option 2, ask to apply for  Patient Assistance Program. Meredeth Ide will ask your household income, and how many people  are in your household. They will quote your out-of-pocket cost based on that information.  [employer] will also be able to set up a 61-month, interest-free payment plan if needed.  Applying the monitor   Shave hair from upper left chest.  Hold abrader disc by orange tab. Rub abrader in 40 strokes over the upper left chest as  indicated in your monitor instructions.  Clean area with 4 enclosed alcohol pads. Let dry.  Apply patch as indicated in monitor instructions. Patch will be placed under collarbone on left  side of chest with arrow pointing upward.  Rub patch adhesive wings for 2 minutes. Remove white label marked "1". Remove the white  label marked "2". Rub patch adhesive wings for 2 additional minutes.  While looking in a mirror, press and release button in center of patch. A small green light will  flash 3-4 times. This will be your only indicator that the monitor has been turned on.  Do not shower for the first 24 hours. You may shower after the first 24 hours.  Press the button if you feel a symptom. You will hear a small click. Record Date, Time and  Symptom in the Patient Logbook.  When you are ready to remove the patch, follow instructions on the last 2 pages of Patient  Logbook. Stick patch monitor onto the last page of Patient Logbook.  Place Patient  Logbook in the blue and white box. Use locking tab on box and tape box closed  securely. The blue and white box has prepaid postage on it. Please place it in the mailbox as  soon as possible. Your physician should have your test results approximately 7 days after the  monitor has been mailed back to [facility].  Call [facility] Customer Care at [phone] if you have questions regarding  your ZIO XT patch monitor. Call them immediately if you see an orange light blinking on your  monitor.  If your monitor falls off in less than 4 days, contact our Monitor department at [phone].  If your monitor becomes loose or falls off after 4 days call [employer] at [phone] for  suggestions on securing your monitor    Follow-Up: At [facility], you and your health needs are our priority.  As part of our continuing mission to provide you with exceptional heart care, we have created designated Provider Care Teams.  These Care Teams include your primary Cardiologist (physician) and Advanced Practice Providers (APPs -  Physician Assistants and Nurse Practitioners) who all work together to provide you with the care you need, when you need it.  We recommend signing up for the patient portal called "MyChart".  Sign up information is provided on this After Visit Summary.  MyChart is used to connect with patients for Virtual Visits (Telemedicine).  Patients are able to view lab/test results, encounter notes, upcoming appointments, etc.  Non-urgent messages can be sent to your provider as well.   To learn more about what you can do with MyChart, go to [email or web address].    Your next appointment:   6 month(s)  The format for your next appointment:   In Person  Provider:   Little Ishikawa, MD     Other Instructions     Signed, Little Ishikawa, MD  03/30/2021 5:25 PM    [facility]

## 2021-03-30 ENCOUNTER — Ambulatory Visit (INDEPENDENT_AMBULATORY_CARE_PROVIDER_SITE_OTHER): Payer: 59 | Admitting: Cardiology

## 2021-03-30 ENCOUNTER — Encounter: Payer: Self-pay | Admitting: Cardiology

## 2021-03-30 ENCOUNTER — Other Ambulatory Visit: Payer: Self-pay

## 2021-03-30 ENCOUNTER — Ambulatory Visit (INDEPENDENT_AMBULATORY_CARE_PROVIDER_SITE_OTHER): Payer: 59

## 2021-03-30 VITALS — BP 118/92 | HR 96 | Ht 67.0 in | Wt 147.0 lb

## 2021-03-30 DIAGNOSIS — R55 Syncope and collapse: Secondary | ICD-10-CM | POA: Diagnosis not present

## 2021-03-30 DIAGNOSIS — R079 Chest pain, unspecified: Secondary | ICD-10-CM

## 2021-03-30 DIAGNOSIS — I4729 Other ventricular tachycardia: Secondary | ICD-10-CM | POA: Diagnosis not present

## 2021-03-30 DIAGNOSIS — R002 Palpitations: Secondary | ICD-10-CM | POA: Diagnosis not present

## 2021-03-30 DIAGNOSIS — R000 Tachycardia, unspecified: Secondary | ICD-10-CM

## 2021-03-30 NOTE — Patient Instructions (Addendum)
Medication Instructions:  °Your physician recommends that you continue on your current medications as directed. Please refer to the Current Medication list given to you today.  °*If you need a refill on your cardiac medications before your next appointment, please call your pharmacy* ° ° °Lab Work: °None °If you have labs (blood work) drawn today and your tests are completely normal, you will receive your results only by: °MyChart Message (if you have MyChart) OR °A paper copy in the mail °If you have any lab test that is abnormal or we need to change your treatment, we will call you to review the results. ° ° °Testing/Procedures: °ZIO XT- Long Term Monitor Instructions ° °Your physician has requested you wear a ZIO patch monitor for 14 days.  °This is a single patch monitor. [employer] supplies one patch monitor per enrollment. Additional °stickers are not available. Please do not apply patch if you will be having a Nuclear Stress Test,  °Echocardiogram, Cardiac CT, MRI, or Chest Xray during the period you would be wearing the  °monitor. The patch cannot be worn during these tests. You cannot remove and re-apply the  °ZIO XT patch monitor.  °Your ZIO patch monitor will be mailed 3 day USPS to your address on file. It may take 3-5 days  °to receive your monitor after you have been enrolled.  °Once you have received your monitor, please review the enclosed instructions. Your monitor  °has already been registered assigning a specific monitor serial # to you. ° °Billing and Patient Assistance Program Information ° °We have supplied [employer] with any of your insurance information on file for billing purposes. °[employer] offers a sliding scale Patient Assistance Program for patients that do not have  °insurance, or whose insurance does not completely cover the cost of the ZIO monitor.  °You must apply for the Patient Assistance Program to qualify for this discounted rate.  °To apply, please call [employer] at [phone], select  option 4, select option 2, ask to apply for  °Patient Assistance Program. [employer] will ask your household income, and how many people  °are in your household. They will quote your out-of-pocket cost based on that information.  °[employer] will also be able to set up a 12-month, interest-free payment plan if needed. ° °Applying the monitor °  °Shave hair from upper left chest.  °Hold abrader disc by orange tab. Rub abrader in 40 strokes over the upper left chest as  °indicated in your monitor instructions.  °Clean area with 4 enclosed alcohol pads. Let dry.  °Apply patch as indicated in monitor instructions. Patch will be placed under collarbone on left  °side of chest with arrow pointing upward.  °Rub patch adhesive wings for 2 minutes. Remove white label marked "1". Remove the white  °label marked "2". Rub patch adhesive wings for 2 additional minutes.  °While looking in a mirror, press and release button in center of patch. A small green light will  °flash 3-4 times. This will be your only indicator that the monitor has been turned on.  °Do not shower for the first 24 hours. You may shower after the first 24 hours.  °Press the button if you feel a symptom. You will hear a small click. Record Date, Time and  °Symptom in the Patient Logbook.  °When you are ready to remove the patch, follow instructions on the last 2 pages of Patient  °Logbook. Stick patch monitor onto the last page of Patient Logbook.  °Place Patient Logbook in   the blue and white box. Use locking tab on box and tape box closed  °securely. The blue and white box has prepaid postage on it. Please place it in the mailbox as  °soon as possible. Your physician should have your test results approximately 7 days after the  °monitor has been mailed back to [employer].  °Call [employer] Customer Care at [phone] if you have questions regarding  °your ZIO XT patch monitor. Call them immediately if you see an orange light blinking on your  °monitor.   °If your monitor falls off in less than 4 days, contact our Monitor department at [phone].  °If your monitor becomes loose or falls off after 4 days call [employer] at [phone] for  °suggestions on securing your monitor ° ° ° °Follow-Up: °At [facility], you and your health needs are our priority.  As part of our continuing mission to provide you with exceptional heart care, we have created designated Provider Care Teams.  These Care Teams include your primary Cardiologist (physician) and Advanced Practice Providers (APPs -  Physician Assistants and Nurse Practitioners) who all work together to provide you with the care you need, when you need it. ° °We recommend signing up for the patient portal called "MyChart".  Sign up information is provided on this After Visit Summary.  MyChart is used to connect with patients for Virtual Visits (Telemedicine).  Patients are able to view lab/test results, encounter notes, upcoming appointments, etc.  Non-urgent messages can be sent to your provider as well.   °To learn more about what you can do with MyChart, go to [email or web address].   ° °Your next appointment:   °6 month(s) ° °The format for your next appointment:   °In Person ° °Provider:   °Christopher L Schumann, MD   ° ° °Other Instructions °  °

## 2021-03-30 NOTE — Progress Notes (Unsigned)
Enrolled for [employer] to mail a ZIO XT long term holter monitor to the patients address on file.  

## 2021-04-05 DIAGNOSIS — R55 Syncope and collapse: Secondary | ICD-10-CM

## 2022-08-30 ENCOUNTER — Ambulatory Visit: Payer: BC Managed Care – PPO | Attending: Physician Assistant

## 2022-08-30 DIAGNOSIS — M25642 Stiffness of left hand, not elsewhere classified: Secondary | ICD-10-CM | POA: Insufficient documentation

## 2022-08-30 DIAGNOSIS — M6281 Muscle weakness (generalized): Secondary | ICD-10-CM | POA: Insufficient documentation

## 2022-08-30 DIAGNOSIS — R278 Other lack of coordination: Secondary | ICD-10-CM | POA: Insufficient documentation

## 2022-08-30 DIAGNOSIS — M79642 Pain in left hand: Secondary | ICD-10-CM | POA: Diagnosis present
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
## 2022-09-03 ENCOUNTER — Ambulatory Visit: Payer: BC Managed Care – PPO

## 2022-09-03 DIAGNOSIS — M6281 Muscle weakness (generalized): Secondary | ICD-10-CM | POA: Diagnosis not present

## 2022-09-03 DIAGNOSIS — M79642 Pain in left hand: Secondary | ICD-10-CM

## 2022-09-03 DIAGNOSIS — M25642 Stiffness of left hand, not elsewhere classified: Secondary | ICD-10-CM
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
## 2022-09-05 ENCOUNTER — Ambulatory Visit: Payer: BC Managed Care – PPO

## 2022-09-05 DIAGNOSIS — M79642 Pain in left hand: Secondary | ICD-10-CM

## 2022-09-05 DIAGNOSIS — M6281 Muscle weakness (generalized): Secondary | ICD-10-CM | POA: Diagnosis not present

## 2022-09-05 DIAGNOSIS — M25642 Stiffness of left hand, not elsewhere classified: Secondary | ICD-10-CM
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
## 2022-09-10 ENCOUNTER — Ambulatory Visit: Payer: BC Managed Care – PPO | Admitting: Occupational Therapy

## 2022-09-10 DIAGNOSIS — M6281 Muscle weakness (generalized): Secondary | ICD-10-CM
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
## 2022-09-12 ENCOUNTER — Ambulatory Visit: Payer: BC Managed Care – PPO

## 2022-09-12 DIAGNOSIS — M79642 Pain in left hand: Secondary | ICD-10-CM

## 2022-09-12 DIAGNOSIS — R278 Other lack of coordination: Secondary | ICD-10-CM

## 2022-09-12 DIAGNOSIS — M6281 Muscle weakness (generalized): Secondary | ICD-10-CM | POA: Diagnosis not present
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
## 2022-09-13 ENCOUNTER — Ambulatory Visit: Payer: BC Managed Care – PPO

## 2022-09-17 ENCOUNTER — Ambulatory Visit: Payer: BC Managed Care – PPO | Attending: Physician Assistant

## 2022-09-17 DIAGNOSIS — M79642 Pain in left hand: Secondary | ICD-10-CM | POA: Diagnosis present

## 2022-09-17 DIAGNOSIS — M25642 Stiffness of left hand, not elsewhere classified: Secondary | ICD-10-CM | POA: Diagnosis present

## 2022-09-17 DIAGNOSIS — M6281 Muscle weakness (generalized): Secondary | ICD-10-CM | POA: Diagnosis present
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
## 2022-09-25 ENCOUNTER — Ambulatory Visit: Payer: BC Managed Care – PPO

## 2022-09-25 DIAGNOSIS — M6281 Muscle weakness (generalized): Secondary | ICD-10-CM | POA: Diagnosis not present

## 2022-09-25 DIAGNOSIS — M25642 Stiffness of left hand, not elsewhere classified: Secondary | ICD-10-CM

## 2022-09-25 DIAGNOSIS — M79642 Pain in left hand: Secondary | ICD-10-CM

## 2022-09-27 ENCOUNTER — Ambulatory Visit: Payer: BC Managed Care – PPO

## 2022-09-27 DIAGNOSIS — M79642 Pain in left hand: Secondary | ICD-10-CM

## 2022-09-27 DIAGNOSIS — M6281 Muscle weakness (generalized): Secondary | ICD-10-CM | POA: Diagnosis not present

## 2022-09-27 DIAGNOSIS — M25642 Stiffness of left hand, not elsewhere classified: Secondary | ICD-10-CM
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
## 2022-10-02 ENCOUNTER — Ambulatory Visit: Payer: BC Managed Care – PPO

## 2022-10-02 DIAGNOSIS — M79642 Pain in left hand: Secondary | ICD-10-CM

## 2022-10-02 DIAGNOSIS — M25642 Stiffness of left hand, not elsewhere classified: Secondary | ICD-10-CM

## 2022-10-02 DIAGNOSIS — M6281 Muscle weakness (generalized): Secondary | ICD-10-CM
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
## 2022-10-04 ENCOUNTER — Ambulatory Visit: Payer: BC Managed Care – PPO

## 2022-10-04 DIAGNOSIS — M25642 Stiffness of left hand, not elsewhere classified: Secondary | ICD-10-CM

## 2022-10-04 DIAGNOSIS — M79642 Pain in left hand: Secondary | ICD-10-CM

## 2022-10-04 DIAGNOSIS — M6281 Muscle weakness (generalized): Secondary | ICD-10-CM
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
## 2022-10-09 ENCOUNTER — Ambulatory Visit: Payer: BC Managed Care – PPO

## 2022-10-11 ENCOUNTER — Ambulatory Visit: Payer: BC Managed Care – PPO

## 2022-10-11 DIAGNOSIS — M6281 Muscle weakness (generalized): Secondary | ICD-10-CM | POA: Diagnosis not present

## 2022-10-11 DIAGNOSIS — M79642 Pain in left hand: Secondary | ICD-10-CM

## 2022-10-11 DIAGNOSIS — M25642 Stiffness of left hand, not elsewhere classified: Secondary | ICD-10-CM
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
## 2022-10-15 ENCOUNTER — Ambulatory Visit: Payer: BC Managed Care – PPO

## 2022-10-22 ENCOUNTER — Ambulatory Visit: Payer: BC Managed Care – PPO

## 2022-10-26 ENCOUNTER — Ambulatory Visit: Payer: BC Managed Care – PPO

## 2022-10-29 ENCOUNTER — Ambulatory Visit: Payer: BC Managed Care – PPO | Attending: Physician Assistant

## 2022-10-29 DIAGNOSIS — M6281 Muscle weakness (generalized): Secondary | ICD-10-CM | POA: Insufficient documentation

## 2022-10-29 DIAGNOSIS — M79642 Pain in left hand: Secondary | ICD-10-CM | POA: Diagnosis present
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
## 2022-12-20 ENCOUNTER — Ambulatory Visit: Payer: BC Managed Care – PPO | Attending: Cardiology | Admitting: Cardiology

## 2022-12-20 ENCOUNTER — Encounter: Payer: Self-pay | Admitting: Cardiology

## 2022-12-20 VITALS — BP 110/80 | HR 79 | Ht 67.0 in | Wt 157.8 lb

## 2022-12-20 DIAGNOSIS — R079 Chest pain, unspecified: Secondary | ICD-10-CM

## 2022-12-20 DIAGNOSIS — R000 Tachycardia, unspecified: Secondary | ICD-10-CM

## 2022-12-20 DIAGNOSIS — R002 Palpitations: Secondary | ICD-10-CM

## 2022-12-20 DIAGNOSIS — R55 Syncope and collapse: Secondary | ICD-10-CM | POA: Diagnosis not present

## 2022-12-20 NOTE — Progress Notes (Signed)
Cardiology Office Note:    Date:  12/20/2022   ID:  Kimberly Solomon, DOB 01-May-1995, MRN [id number]  PCP:  Patient, No Pcp Per  Cardiologist:  Kimberly Ishikawa, MD  Electrophysiologist:  None   Referring MD: No ref. provider found   No chief complaint on file.   History of Present Illness:    Kimberly Solomon is a 27 y.o. female with a hx of ADHD, anxiety, asthma, depression who presents for follow-up visit.  She was initially seen on 11/26/2018.  She was seen by her PCP and noted to be tachycardic to heart rate 118.  She takes amphetamine ER for her ADHD.  Thyroid studies were normal.  She reports she has been having chest tightness with exertion.  She reported that it began this year.  Occurs with running, states that 2-4 minutes into jogging will have chest pain.  Also with shortness of breath and lightheadedness. One episode of syncope when she was in 8th grade, was in chorus and standing and felt like she was going to black out before she passed out.  She has been checking her HR with a pulse-ox, has been up to 150s with just walking.  States that she has episodes where feels like heart is racing, occurs about once every 2 days.  Will last for few minutes but can be up to 20 minutes. Feels lightheadedess and and has chest pain during episodes.  No history of heart disease in immediate family.  Given her exertional chest pain, coronary CTA was ordered to evaluate for coronary artery anomalies and TTE was ordered to evaluate for structural heart disease.  Both studies showed no abnormalities.  Given her palpitations, Zio patch was ordered for 1 week, which showed one 7 beat run of NSVT versus SVT with aberrancy.  Her triggered events corresponded to sinus rhythm.  ETT on 02/26/2019 showed no evidence of ischemia or arrhythmia with stress.  Zio patch x 14 days 03/2021 showed no significant arrhythmias.  Since last clinic visit, she reports she has continued to have episodes of syncope.   Reports typically occurs when she stands up, she will feel lightheaded and her heart will race.  Patient then goes blurry and events that she blacks out.  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  She denies any chest pain, dyspnea, lower extremity edema.  Does report intermittent palpitations, typically occurs when she stands.  Past Medical History:  Diagnosis Date   ADHD (attention deficit hyperactivity disorder)    Allergy    Anxiety    Asthma    Depression    Eating disorder    Mental disorder     Past Surgical History:  Procedure Laterality Date   ADENOIDECTOMY  1999   KNEE SURGERY Left    TYMPANOTOMY  1999   WISDOM TOOTH EXTRACTION  2015    Current Medications: Current Meds  Medication Sig   albuterol (PROVENTIL HFA;VENTOLIN HFA) 108 (90 BASE) MCG/ACT inhaler Inhale 2 puffs into the lungs every 6 (six) hours as needed for wheezing or shortness of breath.   Amphetamine ER (ADZENYS XR-ODT) 18.8 MG TBED Take by mouth daily.   Amphetamine ER (ADZENYS XR-ODT) 6.3 MG TBED Take 6.3 mg by mouth daily at 12 noon. Lunch   BREO ELLIPTA 100-25 MCG/ACT AEPB Inhale 1 puff into the lungs daily.   desvenlafaxine (PRISTIQ) 50 MG 24 hr tablet Take 50 mg by mouth daily.   diphenhydrAMINE (  BENADRYL) 25 MG tablet Take 25 mg by mouth every 6 (six) hours as needed.   ferrous sulfate 325 (65 FE) MG tablet Take 325 mg by mouth daily with breakfast.   guanFACINE (INTUNIV) 2 MG TB24 ER tablet Take 2 mg by mouth daily.   guanFACINE (TENEX) 2 MG tablet Take 2 mg by mouth at bedtime.   hydrOXYzine (ATARAX) 50 MG tablet Take 50 mg by mouth 2 (two) times daily.   hydrOXYzine (VISTARIL) 50 MG capsule Take 50 mg by mouth in the morning and at bedtime. Takes 1 time up to 3 times daily as needed   ibuprofen (ADVIL,MOTRIN) 200 MG tablet Take 400 mg by mouth every 6 (six) hours as needed for mild pain or moderate pain.   levocetirizine (XYZAL) 5 MG tablet Take 5 mg  by mouth daily.   Melatonin 5 MG CAPS Take by mouth.   montelukast (SINGULAIR) 10 MG tablet TAKE 1 TABLET BY MOUTH EVERYDAY AT BEDTIME   norgestimate-ethinyl estradiol (ORTHO-CYCLEN,SPRINTEC,PREVIFEM) 0.25-35 MG-MCG tablet Take 1 tablet by mouth daily.   QNASL 80 MCG/ACT AERS Place 1 spray into both nostrils daily.     Allergies:   Prozac [fluoxetine hcl], Adhesive [tape], Codeine, Latex, Mobic [meloxicam], Neosporin [neomycin-bacitracin zn-polymyx], Amoxicillin, and Penicillins   Social History   Socioeconomic History   Marital status: Single    Spouse name: Not on file   Number of children: 0   Years of education: Not on file   Highest education level: Not on file  Occupational History   Occupation: Student  Tobacco Use   Smoking status: Never   Smokeless tobacco: Never  Vaping Use   Vaping status: Never Used  Substance and Sexual Activity   Alcohol use: No    Alcohol/week: 0.0 standard drinks of alcohol    Comment: rare- last drank summer of 2016- usually 1 drink per episode   Drug use: No   Sexual activity: Yes    Birth control/protection: Pill, Condom  Other Topics Concern   Not on file  Social History Narrative   Born in [place] but raised in [place] by both parents. Pt has one older sister. Pt goes to [employer] and lives alone. She is studying education. Single, never married, no kids.    Social Determinants of Health   Financial Resource Strain: Not on file  Food Insecurity: Not on file  Transportation Needs: Not on file  Physical Activity: Not on file  Stress: Not on file  Social Connections: Not on file     Family History: The patient's family history includes ADD / ADHD in her father; Anxiety disorder in her maternal grandfather, maternal grandmother, and mother; Depression in her maternal grandfather and mother; Diabetes in her paternal grandfather; Drug abuse in her maternal grandfather; Heart disease in her maternal grandfather and paternal  grandfather; Hyperlipidemia in her maternal grandfather, paternal grandfather, and paternal grandmother; Hypertension in her maternal grandfather and paternal grandfather; Migraines in her mother; Stroke in her maternal grandfather. There is no history of Suicidality.  ROS:   Please see the history of present illness.    All other systems reviewed and are negative.  EKGs/Labs/Other Studies Reviewed:    The following studies were reviewed today:   EKG:   12/20/2022: Normal sinus rhythm, rate 79, no ST abnormalities  TTE 12/17/18:  1. Left ventricular ejection fraction, by visual estimation, is 55 to 60%. The left ventricle has normal function. Normal left ventricular size. There is no left ventricular hypertrophy.  2. Global right ventricle has normal systolic function.The right ventricular size is normal. No increase in right ventricular wall thickness.  3. Left atrial size was normal.  4. Right atrial size was normal.  5. Trivial pericardial effusion is present.  6. The mitral valve is normal in structure. Trace mitral valve regurgitation. No evidence of mitral stenosis.  7. The tricuspid valve is normal in structure. Tricuspid valve regurgitation is mild.  8. The aortic valve is tricuspid Aortic valve regurgitation was not visualized by color flow Doppler. Structurally normal aortic valve, with no evidence of sclerosis or stenosis.  9. The pulmonic valve was normal in structure. Pulmonic valve regurgitation is not visualized by color flow Doppler. 10. Normal pulmonary artery systolic pressure. 11. The inferior vena cava is normal in size with greater than 50% respiratory variability, suggesting right atrial pressure of 3 mmHg.  Coronary CTA 12/29/18: 1. Coronary calcium score of 0. This was 0 percentile for age and sex matched control. 2. Normal coronary origin with right dominance. No coronary anomalies 3. No evidence of CAD.  Cardiac monitor 12/24/18: 7 days of data recorded on  Zio monitor. Patient had a min HR of 46 bpm, max HR of 165 bpm, and avg HR of 88 bpm. Predominant underlying rhythm was Sinus Rhythm. No SVT, atrial fibrillation, high degree block, or pauses noted. There was a 7 beat run of NSVT (vs SVT with aberrancy). Isolated atrial and ventricular ectopy was rare (<1%). There were 17 triggered events, corresponding to sinus rhythm and occasional ventricular ectopy.  No significant arrhythmias detected.    Recent Labs: No results found for requested labs within last 365 days.  Recent Lipid Panel No results found for: "CHOL", "TRIG", "HDL", "CHOLHDL", "VLDL", "LDLCALC", "LDLDIRECT"  Physical Exam:    VS:  BP 110/80 (BP Location: Left Arm, Patient Position: Sitting, Cuff Size: Normal)   Pulse 79   Ht 5\' 7"  (1.702 m)   Wt 157 lb 12.8 oz (71.6 kg)   SpO2 99%   BMI 24.71 kg/m     Wt Readings from Last 3 Encounters:  12/20/22 157 lb 12.8 oz (71.6 kg)  03/30/21 147 lb (66.7 kg)  09/20/20 142 lb 12.8 oz (64.8 kg)     GEN: Well nourished, well developed in no acute distress HEENT: Normal NECK: No JVD; No carotid bruits CARDIAC: RRR, no murmurs, rubs, gallops RESPIRATORY:  Clear to auscultation without rales, wheezing or rhonchi  ABDOMEN: Soft, non-tender, non-distended MUSCULOSKELETAL:  No edema; No deformity  SKIN: Warm and dry NEUROLOGIC:  Alert and oriented x 3 PSYCHIATRIC:  Normal affect   ASSESSMENT:    1. Chest pain, unspecified type     PLAN:    Lightheadedness/syncope: No structural heart disease on echocardiogram 11/2018.  Zio patch x 14 days 03/2021 showed no significant arrhythmias. Orthostatics at prior clinic visit show significant increase in heart rate with standing, concerning for POTS.  Advised to stay well-hydrated, goal 3L fluids per day.  Recommend compression stockings.  Discussed that the best long term management of POTS symptoms is gradual exercise conditioning. Recommend seated exercises such as bike to start, to avoid the  risk of falling with lightheadedness -I did discuss the [place] DMV medical guidelines for driving: "it is prudent to recommend that all persons should be free of syncopal episodes for at least six months to be granted the driving privilege." (THE  PHYSICIAN'S GUIDE TO DRIVER MEDICAL EVALUATION, Second Edition, Medical Review Branch, Associate Professor, Division of [employer],  [facility] Department of Transportation, July 2004)  Chest pain: reported exertional chest pain.    TTE 11/2018 showed no evidence of structural heart disease and coronary CTA 12/2018 showed normal coronary origins.  Suspect noncardiac etiology.  Reports symptoms have improved.   Palpitations: reports episodes where feels like heart is racing, was occurs ~every other day, can last up to 20 minutes.  Amphetamine ER that she takes for ADHD is a potential cause, but she is not interested in stopping or changing.  Triggered events on monitor corresponded to sinus rhythm.  She reports palpitations have improved, suspect related to POTS as above   Resting tachycardia: likely related to amphetamine ER she takes for ADHD.  Cardiac monitor shows that heart rate does not remain elevated during the night, arguing against inappropriate sinus tachycardia  NSVT: one 7 beat run of NSVT versus SVT with aberrancy on cardiac monitor.  No evidence of structural heart disease on TTE.  Discussed that definitive test to rule out any structural heart disease, particularly to evaluate the right ventricle, would be cardiac MRI.  She reports she has significant claustrophobia and has required significant sedation for MRIs in the past, so does not think she could cooperate with breath holds to get a cardiac MRI.  ETT on 02/26/2019 showed no evidence of ischemia or arrhythmia with stress.  Repeat Zio patch given recent syncopal episode as above  RTC in 6 months   Medication Adjustments/Labs and Tests Ordered: Current medicines are  reviewed at length with the patient today.  Concerns regarding medicines are outlined above.  Orders Placed This Encounter  Procedures   EKG 12-Lead    No orders of the defined types were placed in this encounter.    Patient Instructions   Other Instructions    Recommendation to drink at least 3 liters of fluid daily   Wear compression socks while awake - do not wear to bed Liberal have salt intake   You will be able to drive again -if you have 6 months free of passing out   Medication Instructions:  No changes   *If you need a refill on your cardiac medications before your next appointment, please call your pharmacy*   Lab Work: Not needed    Testing/Procedures: Not needed   Follow-Up: At [facility], you and your health needs are our priority.  As part of our continuing mission to provide you with exceptional heart care, we have created designated Provider Care Teams.  These Care Teams include your primary Cardiologist (physician) and Advanced Practice Providers (APPs -  Physician Assistants and Nurse Practitioners) who all work together to provide you with the care you need, when you need it.     Your next appointment:   6 month(s)  The format for your next appointment:   In Person  Provider:   Little Ishikawa, MD    Other Instructions    Recommendation to drink at least 3 liters of fluid daily   Wear compression socks while awake - do not wear to bed Liberal have salt intake   You will be able to drive again -if you have 6 months free of passing out   Signed, Kimberly Ishikawa, MD  12/20/2022 5:28 PM    [facility]

## 2022-12-20 NOTE — Patient Instructions (Signed)
  Other Instructions    Recommendation to drink at least 3 liters of fluid daily   Wear compression socks while awake - do not wear to bed Liberal have salt intake   You will be able to drive again -if you have 6 months free of passing out   Medication Instructions:  No changes   *If you need a refill on your cardiac medications before your next appointment, please call your pharmacy*   Lab Work: Not needed    Testing/Procedures: Not needed   Follow-Up: At [facility], you and your health needs are our priority.  As part of our continuing mission to provide you with exceptional heart care, we have created designated Provider Care Teams.  These Care Teams include your primary Cardiologist (physician) and Advanced Practice Providers (APPs -  Physician Assistants and Nurse Practitioners) who all work together to provide you with the care you need, when you need it.     Your next appointment:   6 month(s)  The format for your next appointment:   In Person  Provider:   Little Ishikawa, MD    Other Instructions    Recommendation to drink at least 3 liters of fluid daily   Wear compression socks while awake - do not wear to bed Liberal have salt intake   You will be able to drive again -if you have 6 months free of passing out

## 2023-12-22 NOTE — Progress Notes (Unsigned)
 Cardiology Office Note:    Date:  12/22/2023   ID:  Kimberly Solomon, DOB [birth year], MRN [id number]  PCP:  Patient, No Pcp Per  Cardiologist:  Lonni LITTIE Nanas, MD  Electrophysiologist:  None   Referring MD: No ref. provider found   No chief complaint on file.   History of Present Illness:    Kimberly Solomon is a 28 y.o. female with a hx of ADHD, anxiety, asthma, depression who presents for follow-up visit.  She was initially seen on 11/26/2018.  She was seen by her PCP and noted to be tachycardic to heart rate 118.  She takes amphetamine ER for her ADHD.  Thyroid studies were normal.  She reports she has been having chest tightness with exertion.  She reported that it began this year.  Occurs with running, states that 2-4 minutes into jogging will have chest pain.  Also with shortness of breath and lightheadedness. One episode of syncope when she was in 8th grade, was in chorus and standing and felt like she was going to black out before she passed out.  She has been checking her HR with a pulse-ox, has been up to 150s with just walking.  States that she has episodes where feels like heart is racing, occurs about once every 2 days.  Will last for few minutes but can be up to 20 minutes. Feels lightheadedess and and has chest pain during episodes.  No history of heart disease in immediate family.  Given her exertional chest pain, coronary CTA was ordered to evaluate for coronary artery anomalies and TTE was ordered to evaluate for structural heart disease.  Both studies showed no abnormalities.  Given her palpitations, Zio patch was ordered for 1 week, which showed one 7 beat run of NSVT versus SVT with aberrancy.  Her triggered events corresponded to sinus rhythm.  ETT on 02/26/2019 showed no evidence of ischemia or arrhythmia with stress.  Zio patch x 14 days 03/2021 showed no significant arrhythmias.  Since last clinic visit, she reports she has continued to have episodes of syncope.   Reports typically occurs when she stands up, she will feel lightheaded and her heart will race.  Patient then goes blurry and events that she blacks out.  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  She denies any chest pain, dyspnea, lower extremity edema.  Does report intermittent palpitations, typically occurs when she stands.  Past Medical History:  Diagnosis Date   ADHD (attention deficit hyperactivity disorder)    Allergy    Anxiety    Asthma    Depression    Eating disorder    Mental disorder     Past Surgical History:  Procedure Laterality Date   ADENOIDECTOMY  1999   KNEE SURGERY Left    TYMPANOTOMY  1999   WISDOM TOOTH EXTRACTION  2015    Current Medications: No outpatient medications have been marked as taking for the 12/27/23 encounter (Appointment) with Nanas Lonni LITTIE, MD.     Allergies:   Prozac [fluoxetine hcl], Adhesive [tape], Codeine, Latex, Mobic [meloxicam], Neosporin [neomycin-bacitracin zn-polymyx], Amoxicillin, and Penicillins   Social History   Socioeconomic History   Marital status: Single    Spouse name: Not on file   Number of children: 0   Years of education: Not on file   Highest education level: Not on file  Occupational History   Occupation: Student  Tobacco Use   Smoking status: Never  Smokeless tobacco: Never  Vaping Use   Vaping status: Never Used  Substance and Sexual Activity   Alcohol use: No    Alcohol/week: 0.0 standard drinks of alcohol    Comment: rare- last drank summer of 2016- usually 1 drink per episode   Drug use: No   Sexual activity: Yes    Birth control/protection: Pill, Condom  Other Topics Concern   Not on file  Social History Narrative   Born in [place] but raised in [place] by both parents. Pt has one older sister. Pt goes to [employer] and lives alone. She is studying education. Single, never married, no kids.    Social Drivers of Manufacturing engineer Strain: Not on file  Food Insecurity: Low Risk  (02/13/2023)   Received from [facility]   Hunger Vital Sign    Within the past 12 months, you worried that your food would run out before you got money to buy more: Never true    Within the past 12 months, the food you bought just didn't last and you didn't have money to get more. : Never true  Transportation Needs: No Transportation Needs (02/13/2023)   Received from [facility]    In the past 12 months, has lack of reliable transportation kept you from medical appointments, meetings, work or from getting things needed for daily living? : No  Physical Activity: Not on file  Stress: Not on file  Social Connections: Not on file     Family History: The patient's family history includes ADD / ADHD in her father; Anxiety disorder in her maternal grandfather, maternal grandmother, and mother; Depression in her maternal grandfather and mother; Diabetes in her paternal grandfather; Drug abuse in her maternal grandfather; Heart disease in her maternal grandfather and paternal grandfather; Hyperlipidemia in her maternal grandfather, paternal grandfather, and paternal grandmother; Hypertension in her maternal grandfather and paternal grandfather; Migraines in her mother; Stroke in her maternal grandfather. There is no history of Suicidality.  ROS:   Please see the history of present illness.    All other systems reviewed and are negative.  EKGs/Labs/Other Studies Reviewed:    The following studies were reviewed today:   EKG:   12/20/2022: Normal sinus rhythm, rate 79, no ST abnormalities  TTE 12/17/18:  1. Left ventricular ejection fraction, by visual estimation, is 55 to 60%. The left ventricle has normal function. Normal left ventricular size. There is no left ventricular hypertrophy.  2. Global right ventricle has normal systolic function.The right ventricular size is normal. No increase in right  ventricular wall thickness.  3. Left atrial size was normal.  4. Right atrial size was normal.  5. Trivial pericardial effusion is present.  6. The mitral valve is normal in structure. Trace mitral valve regurgitation. No evidence of mitral stenosis.  7. The tricuspid valve is normal in structure. Tricuspid valve regurgitation is mild.  8. The aortic valve is tricuspid Aortic valve regurgitation was not visualized by color flow Doppler. Structurally normal aortic valve, with no evidence of sclerosis or stenosis.  9. The pulmonic valve was normal in structure. Pulmonic valve regurgitation is not visualized by color flow Doppler. 10. Normal pulmonary artery systolic pressure. 11. The inferior vena cava is normal in size with greater than 50% respiratory variability, suggesting right atrial pressure of 3 mmHg.  Coronary CTA 12/29/18: 1. Coronary calcium score of 0. This was 0 percentile for age and sex matched control. 2. Normal coronary origin  with right dominance. No coronary anomalies 3. No evidence of CAD.  Cardiac monitor 12/24/18: 7 days of data recorded on Zio monitor. Patient had a min HR of 46 bpm, max HR of 165 bpm, and avg HR of 88 bpm. Predominant underlying rhythm was Sinus Rhythm. No SVT, atrial fibrillation, high degree block, or pauses noted. There was a 7 beat run of NSVT (vs SVT with aberrancy). Isolated atrial and ventricular ectopy was rare (<1%). There were 17 triggered events, corresponding to sinus rhythm and occasional ventricular ectopy.  No significant arrhythmias detected.    Recent Labs: No results found for requested labs within last 365 days.  Recent Lipid Panel No results found for: CHOL, TRIG, HDL, CHOLHDL, VLDL, LDLCALC, LDLDIRECT  Physical Exam:    VS:  There were no vitals taken for this visit.    Wt Readings from Last 3 Encounters:  12/20/22 157 lb 12.8 oz (71.6 kg)  03/30/21 147 lb (66.7 kg)  09/20/20 142 lb 12.8 oz (64.8 kg)      GEN: Well nourished, well developed in no acute distress HEENT: Normal NECK: No JVD; No carotid bruits CARDIAC: RRR, no murmurs, rubs, gallops RESPIRATORY:  Clear to auscultation without rales, wheezing or rhonchi  ABDOMEN: Soft, non-tender, non-distended MUSCULOSKELETAL:  No edema; No deformity  SKIN: Warm and dry NEUROLOGIC:  Alert and oriented x 3 PSYCHIATRIC:  Normal affect   ASSESSMENT:    No diagnosis found.   PLAN:    Lightheadedness/syncope: No structural heart disease on echocardiogram 11/2018.  Zio patch x 14 days 03/2021 showed no significant arrhythmias. Orthostatics at prior clinic visit show significant increase in heart rate with standing, concerning for POTS.  Advised to stay well-hydrated, goal 3L fluids per day.  Recommend compression stockings.  Discussed that the best long term management of POTS symptoms is gradual exercise conditioning. Recommend seated exercises such as bike to start, to avoid the risk of falling with lightheadedness -I did discuss the [place] DMV medical guidelines for driving: it is prudent to recommend that all persons should be free of syncopal episodes for at least six months to be granted the driving privilege. (THE [place]  PHYSICIAN'S GUIDE TO DRIVER MEDICAL EVALUATION, Second Edition, Medical Review Branch, Associate Professor, Division of [employer], [place]  Department of Transportation, July 2004)  Chest pain: reported exertional chest pain.    TTE 11/2018 showed no evidence of structural heart disease and coronary CTA 12/2018 showed normal coronary origins.  Suspect noncardiac etiology.  Reports symptoms have improved.   Palpitations: reports episodes where feels like heart is racing, was occurs ~every other day, can last up to 20 minutes.  Amphetamine ER that she takes for ADHD is a potential cause, but she is not interested in stopping or changing.  Triggered events on monitor corresponded to sinus rhythm.  She reports  palpitations have improved, suspect related to POTS as above   Resting tachycardia: likely related to amphetamine ER she takes for ADHD.  Cardiac monitor shows that heart rate does not remain elevated during the night, arguing against inappropriate sinus tachycardia  NSVT: one 7 beat run of NSVT versus SVT with aberrancy on cardiac monitor.  No evidence of structural heart disease on TTE.  Discussed that definitive test to rule out any structural heart disease, particularly to evaluate the right ventricle, would be cardiac MRI.  She reports she has significant claustrophobia and has required significant sedation for MRIs in the past, so does not think she could cooperate with  breath holds to get a cardiac MRI.  ETT on 02/26/2019 showed no evidence of ischemia or arrhythmia with stress.  Repeat Zio patch 04/2021 showed no significant arrhythmias  RTC in 6 months***   Medication Adjustments/Labs and Tests Ordered: Current medicines are reviewed at length with the patient today.  Concerns regarding medicines are outlined above.  No orders of the defined types were placed in this encounter.   No orders of the defined types were placed in this encounter.    There are no Patient Instructions on file for this visit.   Signed, Lonni LITTIE Nanas, MD  12/22/2023 9:20 PM    [facility]

## 2023-12-27 ENCOUNTER — Ambulatory Visit: Payer: Self-pay | Attending: Cardiology | Admitting: Cardiology

## 2023-12-27 ENCOUNTER — Encounter: Payer: Self-pay | Admitting: Cardiology

## 2023-12-27 VITALS — BP 116/80 | HR 93 | Ht 67.0 in | Wt 139.6 lb

## 2023-12-27 DIAGNOSIS — G90A Postural orthostatic tachycardia syndrome (POTS): Secondary | ICD-10-CM

## 2023-12-27 DIAGNOSIS — R002 Palpitations: Secondary | ICD-10-CM | POA: Diagnosis not present

## 2023-12-27 DIAGNOSIS — R000 Tachycardia, unspecified: Secondary | ICD-10-CM | POA: Diagnosis not present

## 2023-12-27 NOTE — Patient Instructions (Signed)
 Medication Instructions:  Your physician recommends that you continue on your current medications as directed. Please refer to the Current Medication list given to you today.  *If you need a refill on your cardiac medications before your next appointment, please call your pharmacy*  Lab Work: none If you have labs (blood work) drawn today and your tests are completely normal, you will receive your results only by: MyChart Message (if you have MyChart) OR A paper copy in the mail If you have any lab test that is abnormal or we need to change your treatment, we will call you to review the results.  Testing/Procedures: none  Follow-Up: At [facility], you and your health needs are our priority.  As part of our continuing mission to provide you with exceptional heart care, our providers are all part of one team.  This team includes your primary Cardiologist (physician) and Advanced Practice Providers or APPs (Physician Assistants and Nurse Practitioners) who all work together to provide you with the care you need, when you need it.  Your next appointment:   1 year  Provider:   Dr. KATE  We recommend signing up for the patient portal called MyChart.  Sign up information is provided on this After Visit Summary.  MyChart is used to connect with patients for Virtual Visits (Telemedicine).  Patients are able to view lab/test results, encounter notes, upcoming appointments, etc.  Non-urgent messages can be sent to your provider as well.   To learn more about what you can do with MyChart, go to [email or web address].   Other Instructions None
